# Patient Record
Sex: Female | Born: 1990 | Race: Black or African American | Hispanic: No | Marital: Single | Smoking: Never smoker
Health system: Southern US, Community
[De-identification: ages and names within clinical notes are randomized; demographics above are authoritative.]

## PROBLEM LIST (undated history)

## (undated) ENCOUNTER — Emergency Department (HOSPITAL_COMMUNITY): Admission: EM | Payer: Self-pay | Source: Home / Self Care

## (undated) DIAGNOSIS — L509 Urticaria, unspecified: Secondary | ICD-10-CM

## (undated) DIAGNOSIS — E119 Type 2 diabetes mellitus without complications: Secondary | ICD-10-CM

## (undated) DIAGNOSIS — E282 Polycystic ovarian syndrome: Secondary | ICD-10-CM

## (undated) HISTORY — DX: Urticaria, unspecified: L50.9

## (undated) HISTORY — DX: Type 2 diabetes mellitus without complications: E11.9

## (undated) HISTORY — DX: Polycystic ovarian syndrome: E28.2

---

## 2013-04-11 ENCOUNTER — Encounter: Payer: BC Managed Care – PPO | Attending: Family Medicine

## 2013-04-11 VITALS — Ht 62.0 in | Wt 236.6 lb

## 2013-04-11 DIAGNOSIS — Z713 Dietary counseling and surveillance: Secondary | ICD-10-CM | POA: Insufficient documentation

## 2013-04-11 DIAGNOSIS — E119 Type 2 diabetes mellitus without complications: Secondary | ICD-10-CM | POA: Insufficient documentation

## 2013-04-17 NOTE — Progress Notes (Signed)
Patient was seen on 04/11/13 for the first of a series of three diabetes self-management courses at the [facility].  Current HbA1c: 9.1%          11/2012  The following learning objectives were met by the patient during this class:  Describe diabetes  State some common risk factors for diabetes  Defines the role of glucose and insulin  Identifies type of diabetes and pathophysiology  Describe the relationship between diabetes and cardiovascular risk  State the members of the Healthcare Team  States the rationale for glucose monitoring  State when to test glucose  State their individual Target Range  State the importance of logging glucose readings  Describe how to interpret glucose readings  Identifies A1C target  Explain the correlation between A1c and eAG values  State symptoms and treatment of high blood glucose  State symptoms and treatment of low blood glucose  Explain proper technique for glucose testing  Identifies proper sharps disposal  Handouts given during class include:  Living Well with Diabetes book  Carb Counting and Meal Planning book  Meal Plan Card  Carbohydrate guide  Meal planning worksheet  Low Sodium Flavoring Tips  The diabetes portion plate  E0C to eAG Conversion Chart  Diabetes Medications  Diabetes Recommended Care Schedule  Support Group  Diabetes Success Plan  Core Class Satisfaction Survey  Follow-Up Plan:  Attend core 2

## 2013-04-18 DIAGNOSIS — E119 Type 2 diabetes mellitus without complications: Secondary | ICD-10-CM

## 2013-04-19 NOTE — Progress Notes (Signed)

## 2013-04-25 DIAGNOSIS — E119 Type 2 diabetes mellitus without complications: Secondary | ICD-10-CM

## 2013-04-25 NOTE — Progress Notes (Signed)
Patient was seen on 04/25/13 for the third of a series of three diabetes self-management courses at the [facility]. The following learning objectives were met by the patient during this class:    State the amount of activity recommended for healthy living   Describe activities suitable for individual needs   Identify ways to regularly incorporate activity into daily life   Identify barriers to activity and ways to over come these barriers  Identify diabetes medications being personally used and their primary action for lowering glucose and possible side effects   Describe role of stress on blood glucose and develop strategies to address psychosocial issues   Identify diabetes complications and ways to prevent them  Explain how to manage diabetes during illness   Evaluate success in meeting personal goal   Establish 2-3 goals that they will plan to diligently work on until they return for the  43-month follow-up visit  Goals:  Follow Diabetes Meal Plan as instructed  Aim for 15-30 mins of physical activity daily as tolerated  Bring food record and glucose log to your follow up visit  Your patient has established the following 4 month goals in their individualized success plan: I will count my carb choices at most meals and snacks I will test my glucose at least 3 times a day, 7 days a week  Your patient has identified these potential barriers to change:  I get so busy at times that I forget to check my glucose levels which should change  I also need to count my carbs  Your patient has identified their diabetes self-care support plan as  [facility]  All of my family and friends are aware and support me

## 2013-08-14 ENCOUNTER — Ambulatory Visit: Payer: BC Managed Care – PPO

## 2015-10-21 ENCOUNTER — Ambulatory Visit: Payer: Self-pay | Admitting: Dietician

## 2015-11-07 ENCOUNTER — Ambulatory Visit: Payer: Self-pay | Admitting: Dietician

## 2016-12-06 ENCOUNTER — Emergency Department (HOSPITAL_BASED_OUTPATIENT_CLINIC_OR_DEPARTMENT_OTHER): Payer: 59

## 2016-12-06 ENCOUNTER — Encounter (HOSPITAL_BASED_OUTPATIENT_CLINIC_OR_DEPARTMENT_OTHER): Payer: Self-pay

## 2016-12-06 ENCOUNTER — Emergency Department (HOSPITAL_BASED_OUTPATIENT_CLINIC_OR_DEPARTMENT_OTHER)
Admission: EM | Admit: 2016-12-06 | Discharge: 2016-12-06 | Disposition: A | Discharge status: Home / Self Care | Payer: 59 | Attending: Emergency Medicine | Admitting: Emergency Medicine

## 2016-12-06 DIAGNOSIS — N390 Urinary tract infection, site not specified: Secondary | ICD-10-CM

## 2016-12-06 DIAGNOSIS — R102 Pelvic and perineal pain unspecified side: Secondary | ICD-10-CM

## 2016-12-06 DIAGNOSIS — E119 Type 2 diabetes mellitus without complications: Secondary | ICD-10-CM | POA: Diagnosis not present

## 2016-12-06 DIAGNOSIS — N760 Acute vaginitis: Secondary | ICD-10-CM | POA: Diagnosis not present

## 2016-12-06 DIAGNOSIS — E282 Polycystic ovarian syndrome: Secondary | ICD-10-CM | POA: Diagnosis not present

## 2016-12-06 DIAGNOSIS — B9689 Other specified bacterial agents as the cause of diseases classified elsewhere: Secondary | ICD-10-CM

## 2016-12-06 LAB — PREGNANCY, URINE: PREG TEST UR: NEGATIVE

## 2016-12-06 LAB — URINALYSIS, MICROSCOPIC (REFLEX): RBC / HPF: NONE SEEN RBC/hpf (ref 0–5)

## 2016-12-06 LAB — WET PREP, GENITAL
Sperm: NONE SEEN
TRICH WET PREP: NONE SEEN
YEAST WET PREP: NONE SEEN

## 2016-12-06 LAB — URINALYSIS, ROUTINE W REFLEX MICROSCOPIC
GLUCOSE, UA: NEGATIVE mg/dL
Hgb urine dipstick: NEGATIVE
Ketones, ur: >80 mg/dL — AB
Nitrite: NEGATIVE
Protein, ur: 30 mg/dL — AB
SPECIFIC GRAVITY, URINE: 1.02 (ref 1.005–1.030)
pH: 6 (ref 5.0–8.0)

## 2016-12-06 LAB — CBG MONITORING, ED: Glucose-Capillary: 129 mg/dL — ABNORMAL HIGH (ref 65–99)

## 2016-12-06 MED ORDER — METRONIDAZOLE 500 MG PO TABS: 500.0000 mg | ORAL_TABLET | Freq: Two times a day (BID) | ORAL | 0 refills | Status: DC

## 2016-12-06 MED ORDER — AZITHROMYCIN 250 MG PO TABS
1000.0000 mg | ORAL_TABLET | Freq: Once | ORAL | Status: AC
  Administered 2016-12-06: 1000 mg via ORAL
  Filled 2016-12-06: qty 4

## 2016-12-06 MED ORDER — NITROFURANTOIN MONOHYD MACRO 100 MG PO CAPS: 100.0000 mg | ORAL_CAPSULE | Freq: Two times a day (BID) | ORAL | 0 refills | Status: DC

## 2016-12-06 MED ORDER — CEFTRIAXONE SODIUM 250 MG IJ SOLR
250.0000 mg | Freq: Once | INTRAMUSCULAR | Status: AC
  Administered 2016-12-06: 250 mg via INTRAMUSCULAR
  Filled 2016-12-06: qty 250

## 2016-12-06 MED ORDER — NAPROXEN 500 MG PO TABS: 500.0000 mg | ORAL_TABLET | Freq: Two times a day (BID) | ORAL | 0 refills | Status: DC

## 2016-12-06 NOTE — ED Notes (Signed)
ED Provider at bedside. 

## 2016-12-06 NOTE — ED Provider Notes (Signed)
[facility] EMERGENCY DEPARTMENT Provider Note   CSN: [id number] Arrival date & time: 12/06/16  4098     History   Chief Complaint Chief Complaint  Patient presents with  . Pelvic Pain    HPI Pamela Gill is a 26 y.o. female.  HPI Pamela Gill is a 26 y.o. female history of diabetes and polycystic ovarian syndrome, presents to emergency department complaining of pelvic pain.  Patient states her pain started approximately 3 days ago.  She reports at times sharp but mostly crampy pain in the lower abdomen, worse on the left.  She reports pain is worsened with any movement, with urination, with having a bowel movement.  She states pain started right after she finished her menstrual cycle.  She states her menstrual cycle was normal.  States she has not had intercourse in several months and does not believe she has STDs.  She denies any vaginal discharge or bleeding at this time.  She denies any urinary symptoms other than discomfort when she urinates.  She states she had a normal bowel movement yesterday, however reports some associated cramping. she denies any fever or chills.  She states she has had ovarian cysts in the past and states this feels similar but only worse.  She has not taken any medication to help with her pain.   Past Medical History:  Diagnosis Date  . Diabetes mellitus without complication (HCC)   . PCOS (polycystic ovarian syndrome)     There are no active problems to display for this patient.   History reviewed. No pertinent surgical history.  OB History    No data available       Home Medications    Prior to Admission medications   Medication Sig Start Date End Date Taking? Authorizing Provider  METFORMIN HCL PO Take by mouth.   Yes [provider]  norethindrone-ethinyl estradiol (JUNEL FE,GILDESS FE,LOESTRIN FE) 1-20 MG-MCG tablet Take 1 tablet by mouth See admin instructions. Take 1 tablet daily for three weeks, 1 week off.     [provider]    Family History History reviewed. No pertinent family history.  Social History Social History  Substance Use Topics  . Smoking status: Never Smoker  . Smokeless tobacco: Never Used  . Alcohol use No     Allergies   Metformin and related and Penicillins   Review of Systems Review of Systems  Constitutional: Negative for chills and fever.  Respiratory: Negative for cough, chest tightness and shortness of breath.   Cardiovascular: Negative for chest pain, palpitations and leg swelling.  Gastrointestinal: Positive for abdominal pain. Negative for diarrhea, nausea and vomiting.  Genitourinary: Positive for pelvic pain. Negative for dysuria, flank pain, vaginal bleeding, vaginal discharge and vaginal pain.  Musculoskeletal: Negative for arthralgias, myalgias, neck pain and neck stiffness.  Skin: Negative for rash.  Neurological: Negative for dizziness, weakness and headaches.  All other systems reviewed and are negative.    Physical Exam Updated Vital Signs BP 138/85 (BP Location: Left Arm)   Pulse (!) 108   Temp 98.7 F (37.1 C) (Oral)   Resp 20   Ht 5\' 2"  (1.575 m)   Wt 98 kg (216 lb)   LMP 11/30/2016   SpO2 100%   BMI 39.51 kg/m   Physical Exam  Constitutional: She appears well-developed and well-nourished. No distress.  HENT:  Head: Normocephalic.  Eyes: Conjunctivae are normal.  Neck: Neck supple.  Cardiovascular: Normal rate, regular rhythm and normal heart sounds.  Pulmonary/Chest: Effort normal and breath sounds normal. No respiratory distress. She has no wheezes. She has no rales.  Abdominal: Soft. Bowel sounds are normal. She exhibits no distension. There is tenderness. There is no rebound.  Diffuse tenderness, worse in the suprapubic area and left lower quadrant  Genitourinary:  Genitourinary Comments: Normal external genitalia.  White thick vaginal discharge.  Cervix is friable, erythematous.  Positive cervical motion  tenderness.  No uterine tenderness.  Positive left adnexal tenderness.  Musculoskeletal: She exhibits no edema.  Neurological: She is alert.  Skin: Skin is warm and dry.  Psychiatric: She has a normal mood and affect. Her behavior is normal.  Nursing note and vitals reviewed.    ED Treatments / Results  Labs (all labs ordered are listed, but only abnormal results are displayed) Labs Reviewed  WET PREP, GENITAL - Abnormal; Notable for the following:       Result Value   Clue Cells Wet Prep HPF POC PRESENT (*)    WBC, Wet Prep HPF POC MANY (*)    All other components within normal limits  URINALYSIS, ROUTINE W REFLEX MICROSCOPIC - Abnormal; Notable for the following:    APPearance CLOUDY (*)    Bilirubin Urine SMALL (*)    Ketones, ur >80 (*)    Protein, ur 30 (*)    Leukocytes, UA SMALL (*)    All other components within normal limits  URINALYSIS, MICROSCOPIC (REFLEX) - Abnormal; Notable for the following:    Bacteria, UA MANY (*)    Squamous Epithelial / LPF 6-30 (*)    All other components within normal limits  CBG MONITORING, ED - Abnormal; Notable for the following:    Glucose-Capillary 129 (*)    All other components within normal limits  PREGNANCY, URINE  GC/CHLAMYDIA PROBE AMP ([place]    EKG  EKG Interpretation None       Radiology [place] Transvaginal Non-ob  Result Date: 12/06/2016 CLINICAL DATA:  Left-sided pelvic pain for 4 days. Polycystic ovarian syndrome. Clinical suspicion for ovarian torsion. EXAM: TRANSABDOMINAL AND TRANSVAGINAL ULTRASOUND OF PELVIS DOPPLER ULTRASOUND OF OVARIES TECHNIQUE: Both transabdominal and transvaginal ultrasound examinations of the pelvis were performed. Transabdominal technique was performed for global imaging of the pelvis including uterus, ovaries, adnexal regions, and pelvic cul-de-sac. It was necessary to proceed with endovaginal exam following the transabdominal exam to visualize the endometrium and ovarian  cysts. Color and duplex Doppler ultrasound was utilized to evaluate blood flow to the ovaries. COMPARISON:  None. FINDINGS: Uterus Measurements: 7.4 x 3.3 x 4.3 cm. No fibroids or other mass visualized. Endometrium Thickness: 7 mm.  No focal abnormality visualized. Right ovary Measurements: 4.2 x 3.8 x 2.8 cm (volume = 23 cm^3). 2.4 cm follicular cyst noted. Otherwise normal appearance/no adnexal mass. Left ovary Measurements: 3.6 x 2.3 x 2.5 cm (volume = 11 cm^3). No evidence of ovarian or adnexal mass. Multiple less than 1cm follicles are seen throughout the ovary, predominantly peripheral in location. 1.7 cm follicular cysts also noted. Pulsed Doppler evaluation of both ovaries demonstrates normal low-resistance arterial and venous waveforms. Other findings No abnormal free fluid. IMPRESSION: No pelvic mass or other acute findings identified. No sonographic evidence for ovarian torsion. Bilateral polycystic ovarian morphology noted, consistent with known diagnosis of polycystic ovarian syndrome. Electronically Signed   By: Myles RosenthalJohn  Stahl M.D.   On: 12/06/2016 12:27   [place] Pelvis Complete  Result Date: 12/06/2016 CLINICAL DATA:  Left-sided pelvic pain for 4 days. Polycystic ovarian syndrome.  Clinical suspicion for ovarian torsion. EXAM: TRANSABDOMINAL AND TRANSVAGINAL ULTRASOUND OF PELVIS DOPPLER ULTRASOUND OF OVARIES TECHNIQUE: Both transabdominal and transvaginal ultrasound examinations of the pelvis were performed. Transabdominal technique was performed for global imaging of the pelvis including uterus, ovaries, adnexal regions, and pelvic cul-de-sac. It was necessary to proceed with endovaginal exam following the transabdominal exam to visualize the endometrium and ovarian cysts. Color and duplex Doppler ultrasound was utilized to evaluate blood flow to the ovaries. COMPARISON:  None. FINDINGS: Uterus Measurements: 7.4 x 3.3 x 4.3 cm. No fibroids or other mass visualized. Endometrium Thickness: 7 mm.  No focal  abnormality visualized. Right ovary Measurements: 4.2 x 3.8 x 2.8 cm (volume = 23 cm^3). 2.4 cm follicular cyst noted. Otherwise normal appearance/no adnexal mass. Left ovary Measurements: 3.6 x 2.3 x 2.5 cm (volume = 11 cm^3). No evidence of ovarian or adnexal mass. Multiple less than 1cm follicles are seen throughout the ovary, predominantly peripheral in location. 1.7 cm follicular cysts also noted. Pulsed Doppler evaluation of both ovaries demonstrates normal low-resistance arterial and venous waveforms. Other findings No abnormal free fluid. IMPRESSION: No pelvic mass or other acute findings identified. No sonographic evidence for ovarian torsion. Bilateral polycystic ovarian morphology noted, consistent with known diagnosis of polycystic ovarian syndrome. Electronically Signed   By: Myles Rosenthal M.D.   On: 12/06/2016 12:27   [place] Art/ven Flow Abd Pelv Doppler  Result Date: 12/06/2016 CLINICAL DATA:  Left-sided pelvic pain for 4 days. Polycystic ovarian syndrome. Clinical suspicion for ovarian torsion. EXAM: TRANSABDOMINAL AND TRANSVAGINAL ULTRASOUND OF PELVIS DOPPLER ULTRASOUND OF OVARIES TECHNIQUE: Both transabdominal and transvaginal ultrasound examinations of the pelvis were performed. Transabdominal technique was performed for global imaging of the pelvis including uterus, ovaries, adnexal regions, and pelvic cul-de-sac. It was necessary to proceed with endovaginal exam following the transabdominal exam to visualize the endometrium and ovarian cysts. Color and duplex Doppler ultrasound was utilized to evaluate blood flow to the ovaries. COMPARISON:  None. FINDINGS: Uterus Measurements: 7.4 x 3.3 x 4.3 cm. No fibroids or other mass visualized. Endometrium Thickness: 7 mm.  No focal abnormality visualized. Right ovary Measurements: 4.2 x 3.8 x 2.8 cm (volume = 23 cm^3). 2.4 cm follicular cyst noted. Otherwise normal appearance/no adnexal mass. Left ovary Measurements: 3.6 x 2.3 x 2.5 cm (volume = 11 cm^3).  No evidence of ovarian or adnexal mass. Multiple less than 1cm follicles are seen throughout the ovary, predominantly peripheral in location. 1.7 cm follicular cysts also noted. Pulsed Doppler evaluation of both ovaries demonstrates normal low-resistance arterial and venous waveforms. Other findings No abnormal free fluid. IMPRESSION: No pelvic mass or other acute findings identified. No sonographic evidence for ovarian torsion. Bilateral polycystic ovarian morphology noted, consistent with known diagnosis of polycystic ovarian syndrome. Electronically Signed   By: Myles Rosenthal M.D.   On: 12/06/2016 12:27    Procedures Procedures (including critical care time)  Medications Ordered in ED Medications - No data to display   Initial Impression / Assessment and Plan / ED Course  I have reviewed the triage vital signs and the nursing notes.  Pertinent labs & imaging results that were available during my care of the patient were reviewed by me and considered in my medical decision making (see chart for details).     Patient with pelvic pain, pelvic exam suspicious for cervicitis.  Urine showing greater than 80 ketones, patient states she has not had anything to eat or drink this morning yet.  Advised to drink lots of fluids.  Her blood sugar is 129.  She otherwise does not feel sick, no nausea or vomiting.  She does have many bacteria in urine.  Will start on Macrobid for possible UTI.  Wet prep showed many bacteria with clue cells.  Will treat for BV.  Also covered for possible gonorrhea and chlamydia with Rocephin 250 mg IM and Zithromax 1 g p.o.  Ultrasound performed to rule out ovarian torsion or tubo-ovarian.  The ultrasound is consistent with polycystic ovarian disease, no other acute findings.  At this time no concern for other intra-abdominal pathology, plan to discharge home with NSAIDs, antibiotics, follow-up as needed.  Return precautions discussed.  Vital signs are normal, patient in no acute  distress.  Vitals:   12/06/16 0922 12/06/16 1242  BP: 138/85 129/85  Pulse: (!) 108 98  Resp: 20 20  Temp: 98.7 F (37.1 C)   TempSrc: Oral   SpO2: 100% 98%  Weight: 98 kg (216 lb)   Height: 5\' 2"  (1.575 m)      Final Clinical Impressions(s) / ED Diagnoses   Final diagnoses:  Pelvic pain in female  Urinary tract infection without hematuria, site unspecified  Polycystic ovarian disease  Bacterial vaginitis    New Prescriptions New Prescriptions   METRONIDAZOLE (FLAGYL) 500 MG TABLET    Take 1 tablet (500 mg total) by mouth 2 (two) times daily.   NAPROXEN (NAPROSYN) 500 MG TABLET    Take 1 tablet (500 mg total) by mouth 2 (two) times daily.   NITROFURANTOIN, MACROCRYSTAL-MONOHYDRATE, (MACROBID) 100 MG CAPSULE    Take 1 capsule (100 mg total) by mouth 2 (two) times daily.     Jaynie Crumble, PA-C 12/06/16 1253    Arby Barrette, MD 12/09/16 2303

## 2016-12-06 NOTE — ED Notes (Signed)
PA and RN informed about CBG

## 2016-12-06 NOTE — ED Triage Notes (Signed)
Pt reports pelvic pain x4 days - denies n/v/d, rash, fever, or vaginal discharge. Pt has PCOS with pain in the same area in the past but not this intense.

## 2016-12-06 NOTE — Discharge Instructions (Signed)
Take naprosyn for pain. Macrobid until all gone for UTI. Flagyl for bacterial vaginosis. Follow up with family doctor. Return if worsening symptoms.

## 2016-12-07 LAB — GC/CHLAMYDIA PROBE AMP (~~LOC~~) NOT AT ARMC
CHLAMYDIA, DNA PROBE: NEGATIVE
Neisseria Gonorrhea: NEGATIVE

## 2017-03-13 ENCOUNTER — Encounter (HOSPITAL_COMMUNITY): Payer: Self-pay | Admitting: *Deleted

## 2017-03-13 ENCOUNTER — Ambulatory Visit (HOSPITAL_COMMUNITY)
Admission: EM | Admit: 2017-03-13 | Discharge: 2017-03-13 | Disposition: A | Discharge status: Home / Self Care | Payer: 59 | Attending: Family Medicine | Admitting: Family Medicine

## 2017-03-13 DIAGNOSIS — R05 Cough: Secondary | ICD-10-CM | POA: Diagnosis not present

## 2017-03-13 DIAGNOSIS — R69 Illness, unspecified: Secondary | ICD-10-CM | POA: Diagnosis not present

## 2017-03-13 DIAGNOSIS — R52 Pain, unspecified: Secondary | ICD-10-CM

## 2017-03-13 DIAGNOSIS — J111 Influenza due to unidentified influenza virus with other respiratory manifestations: Secondary | ICD-10-CM

## 2017-03-13 MED ORDER — HYDROCODONE-HOMATROPINE 5-1.5 MG/5ML PO SYRP: 5.0000 mL | ORAL_SOLUTION | Freq: Four times a day (QID) | ORAL | 0 refills | Status: DC | PRN

## 2017-03-13 MED ORDER — OSELTAMIVIR PHOSPHATE 75 MG PO CAPS: 75.0000 mg | ORAL_CAPSULE | Freq: Two times a day (BID) | ORAL | 0 refills | Status: DC

## 2017-03-13 NOTE — ED Notes (Signed)
Patient discharged by provider.

## 2017-03-13 NOTE — Discharge Instructions (Signed)

## 2017-03-13 NOTE — ED Triage Notes (Signed)
Patient reports 2-3 days of cold like symptoms, scratchy throat, headache, body aches.

## 2017-03-15 NOTE — ED Provider Notes (Signed)
  [facility]   [id number] 03/13/17 Arrival Time: 1721  ASSESSMENT & PLAN:  1. Influenza-like illness     Meds ordered this encounter  Medications  . oseltamivir (TAMIFLU) 75 MG capsule    Sig: Take 1 capsule (75 mg total) by mouth every 12 (twelve) hours.    Dispense:  10 capsule    Refill:  0  . HYDROcodone-homatropine (HYCODAN) 5-1.5 MG/5ML syrup    Sig: Take 5 mLs by mouth every 6 (six) hours as needed for cough.    Dispense:  90 mL    Refill:  0   Cough medication sedation precautions. Discussed typical duration of symptoms. OTC symptom care as needed. Ensure adequate fluid intake and rest. May f/u with PCP or here as needed.  Reviewed expectations re: course of current medical issues. Questions answered. Outlined signs and symptoms indicating need for more acute intervention. Patient verbalized understanding. After Visit Summary given.   SUBJECTIVE: History from: patient.  Pamela Gill is a 27 y.o. female who presents with complaint of nasal congestion, post-nasal drainage, and a persistent dry cough. Onset abrupt, approximately 1-2 days ago. Overall fatigued with body aches. SOB: none. Wheezing: none. Fever: subjective. Overall normal PO intake without n/v. Sick contacts: no. OTC treatment: cough medication without much help.  Received flu shot this year: no.  Social History   Tobacco Use  Smoking Status Never Smoker  Smokeless Tobacco Never Used    ROS: As per HPI.   OBJECTIVE:  Vitals:   03/13/17 1815  Pulse: 79  Resp: 18  Temp: 99.7 F (37.6 C)  TempSrc: Oral  SpO2: 100%     General appearance: alert; appears fatigued HEENT: nasal congestion; clear runny nose; throat irritation secondary to post-nasal drainage Neck: supple without LAD Lungs: unlabored respirations, symmetrical air entry; cough: moderate; no respiratory distress Skin: warm and dry Psychological: alert and cooperative; normal mood and affect   Allergies  Allergen  Reactions  . Metformin And Related Nausea And Vomiting  . Penicillins     Reaction unknown    Past Medical History:  Diagnosis Date  . Diabetes mellitus without complication (HCC)   . PCOS (polycystic ovarian syndrome)    Family History  Problem Relation Age of Onset  . Hypertension Mother    Social History   Socioeconomic History  . Marital status: Single    Spouse name: Not on file  . Number of children: Not on file  . Years of education: Not on file  . Highest education level: Not on file  Social Needs  . Financial resource strain: Not on file  . Food insecurity - worry: Not on file  . Food insecurity - inability: Not on file  . Transportation needs - medical: Not on file  . Transportation needs - non-medical: Not on file  Occupational History  . Not on file  Tobacco Use  . Smoking status: Never Smoker  . Smokeless tobacco: Never Used  Substance and Sexual Activity  . Alcohol use: No  . Drug use: No  . Sexual activity: Not on file  Other Topics Concern  . Not on file  Social History Narrative  . Not on file           Mardella LaymanHagler, Cristina Mattern, MD 03/15/17 1321

## 2017-08-06 DIAGNOSIS — Z1339 Encounter for screening examination for other mental health and behavioral disorders: Secondary | ICD-10-CM | POA: Diagnosis not present

## 2017-08-06 DIAGNOSIS — Z114 Encounter for screening for human immunodeficiency virus [HIV]: Secondary | ICD-10-CM | POA: Diagnosis not present

## 2017-08-06 DIAGNOSIS — R5383 Other fatigue: Secondary | ICD-10-CM | POA: Diagnosis not present

## 2017-08-06 DIAGNOSIS — Z1331 Encounter for screening for depression: Secondary | ICD-10-CM | POA: Diagnosis not present

## 2017-08-06 DIAGNOSIS — Z0000 Encounter for general adult medical examination without abnormal findings: Secondary | ICD-10-CM | POA: Diagnosis not present

## 2017-08-06 DIAGNOSIS — R87610 Atypical squamous cells of undetermined significance on cytologic smear of cervix (ASC-US): Secondary | ICD-10-CM | POA: Diagnosis not present

## 2017-08-06 DIAGNOSIS — E559 Vitamin D deficiency, unspecified: Secondary | ICD-10-CM | POA: Diagnosis not present

## 2017-08-06 DIAGNOSIS — E119 Type 2 diabetes mellitus without complications: Secondary | ICD-10-CM | POA: Diagnosis not present

## 2017-08-06 DIAGNOSIS — R0602 Shortness of breath: Secondary | ICD-10-CM | POA: Diagnosis not present

## 2017-08-06 DIAGNOSIS — Z7984 Long term (current) use of oral hypoglycemic drugs: Secondary | ICD-10-CM | POA: Diagnosis not present

## 2017-08-13 DIAGNOSIS — Z7984 Long term (current) use of oral hypoglycemic drugs: Secondary | ICD-10-CM | POA: Diagnosis not present

## 2017-08-13 DIAGNOSIS — E559 Vitamin D deficiency, unspecified: Secondary | ICD-10-CM | POA: Diagnosis not present

## 2017-08-13 DIAGNOSIS — E668 Other obesity: Secondary | ICD-10-CM | POA: Diagnosis not present

## 2017-08-13 DIAGNOSIS — E119 Type 2 diabetes mellitus without complications: Secondary | ICD-10-CM | POA: Diagnosis not present

## 2017-08-13 DIAGNOSIS — Z79899 Other long term (current) drug therapy: Secondary | ICD-10-CM | POA: Diagnosis not present

## 2017-09-17 DIAGNOSIS — N926 Irregular menstruation, unspecified: Secondary | ICD-10-CM | POA: Diagnosis not present

## 2017-09-17 DIAGNOSIS — E119 Type 2 diabetes mellitus without complications: Secondary | ICD-10-CM | POA: Diagnosis not present

## 2017-09-17 DIAGNOSIS — E559 Vitamin D deficiency, unspecified: Secondary | ICD-10-CM | POA: Diagnosis not present

## 2017-09-17 DIAGNOSIS — Z7984 Long term (current) use of oral hypoglycemic drugs: Secondary | ICD-10-CM | POA: Diagnosis not present

## 2017-09-17 DIAGNOSIS — E668 Other obesity: Secondary | ICD-10-CM | POA: Diagnosis not present

## 2017-11-29 ENCOUNTER — Ambulatory Visit (HOSPITAL_COMMUNITY)
Admission: EM | Admit: 2017-11-29 | Discharge: 2017-11-29 | Disposition: A | Discharge status: Home / Self Care | Payer: 59 | Attending: Family Medicine | Admitting: Family Medicine

## 2017-11-29 ENCOUNTER — Ambulatory Visit (INDEPENDENT_AMBULATORY_CARE_PROVIDER_SITE_OTHER): Payer: 59

## 2017-11-29 ENCOUNTER — Encounter (HOSPITAL_COMMUNITY): Payer: Self-pay | Admitting: Emergency Medicine

## 2017-11-29 DIAGNOSIS — S60221A Contusion of right hand, initial encounter: Secondary | ICD-10-CM | POA: Diagnosis not present

## 2017-11-29 DIAGNOSIS — M79641 Pain in right hand: Secondary | ICD-10-CM | POA: Diagnosis not present

## 2017-11-29 DIAGNOSIS — W230XXA Caught, crushed, jammed, or pinched between moving objects, initial encounter: Secondary | ICD-10-CM | POA: Diagnosis not present

## 2017-11-29 NOTE — Discharge Instructions (Addendum)
Ice Elevate Ibuprofen as needed pain and inflammation Limit use of hand while painful See your PCP if not better in a week

## 2017-11-29 NOTE — ED Triage Notes (Signed)
Pt states today she had her R hand caught in the car door, slammed in door. No bruising or swelling noted.  Pt c/o R thumb pain. Nail on thumb is cracked. No wrist pain.

## 2017-11-29 NOTE — ED Provider Notes (Signed)
[facility]    CSN: [id number] Arrival date & time: 11/29/17  4098     History   Chief Complaint Chief Complaint  Patient presents with  . Hand Pain    HPI Delona Clasby is a 27 y.o. female.   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 Otherwise well No prior injury  Past Medical History:  Diagnosis Date  . Diabetes mellitus without complication (HCC)   . PCOS (polycystic ovarian syndrome)     There are no active problems to display for this patient.   History reviewed. No pertinent surgical history.  OB History   None      Home Medications    Prior to Admission medications   Medication Sig Start Date End Date Taking? Authorizing Provider  METFORMIN HCL PO Take by mouth.    [provider]  norethindrone-ethinyl estradiol (JUNEL FE,GILDESS FE,LOESTRIN FE) 1-20 MG-MCG tablet Take 1 tablet by mouth See admin instructions. Take 1 tablet daily for three weeks, 1 week off.    [provider]    Family History Family History  Problem Relation Age of Onset  . Hypertension Mother     Social History Social History   Tobacco Use  . Smoking status: Never Smoker  . Smokeless tobacco: Never Used  Substance Use Topics  . Alcohol use: No  . Drug use: No     Allergies   Metformin and related and Penicillins   Review of Systems Review of Systems  Constitutional: Negative for chills and fever.  HENT: Negative for ear pain and sore throat.   Eyes: Negative for pain and visual disturbance.  Respiratory: Negative for cough and shortness of breath.   Cardiovascular: Negative for chest pain and palpitations.  Gastrointestinal: Negative for abdominal pain and vomiting.  Genitourinary: Negative for dysuria and hematuria.  Musculoskeletal: Positive for  arthralgias. Negative for back pain.  Skin: Negative for color change and rash.  Neurological: Negative for seizures and syncope.  All other systems reviewed and are negative.    Physical Exam Triage Vital Signs ED Triage Vitals  Enc Vitals Group     BP 11/29/17 1019 127/87     Pulse Rate 11/29/17 1019 78     Resp 11/29/17 1019 16     Temp 11/29/17 1019 98.6 F (37 C)     Temp src --      SpO2 11/29/17 1019 97 %     Weight --      Height --      Head Circumference --      Peak Flow --      Pain Score 11/29/17 1022 4     Pain Loc --      Pain Edu? --      Excl. in GC? --    No data found.  Updated Vital Signs BP 127/87   Pulse 78   Temp 98.6 F (37 C)   Resp 16   LMP 11/09/2017 (Exact Date)   SpO2 97%       Physical Exam  Constitutional: She appears well-developed and well-nourished. No distress.  HENT:  Head: Normocephalic and atraumatic.  Mouth/Throat: Oropharynx is clear and moist.  Eyes: Pupils are equal, round, and reactive to light. Conjunctivae are normal.  Neck: Normal range of motion.  Cardiovascular: Normal  rate.  Pulmonary/Chest: Effort normal. No respiratory distress.  Abdominal: Soft. She exhibits no distension.  Musculoskeletal: Normal range of motion. She exhibits no edema.  Tenderness around the PIP of right thumb with slight swelling and discoloration.  Tender web space and thenar eminence.  Cap refill intact.  Sensation intact  Neurological: She is alert.  Skin: Skin is warm and dry.  Psychiatric: She has a normal mood and affect. Her behavior is normal.     UC Treatments / Results  Labs (all labs ordered are listed, but only abnormal results are displayed) Labs Reviewed - No data to display  EKG None  Radiology Dg Hand Complete Right  Result Date: 11/29/2017 CLINICAL DATA:  27 year old female slammed right hand in car door this morning. Right thumbnail cracked. Pain including the mid 2nd metacarpal. EXAM: RIGHT HAND - COMPLETE 3+  VIEW COMPARISON:  None. FINDINGS: Bone mineralization is within normal limits. There is no evidence of fracture or dislocation. There is no evidence of arthropathy or other focal bone abnormality. No discrete soft tissue injury. No soft tissue gas. IMPRESSION: Negative. Electronically Signed   By: Odessa Fleming M.D.   On: 11/29/2017 10:44    Procedures Procedures (including critical care time)  Medications Ordered in UC Medications - No data to display  Initial Impression / Assessment and Plan / UC Course  I have reviewed the triage vital signs and the nursing notes.  Pertinent labs & imaging results that were available during my care of the patient were reviewed by me and considered in my medical decision making (see chart for details).     Discussed hand contusion.  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 Final Clinical Impressions(s) / UC Diagnoses   Final diagnoses:  Contusion of right hand, initial encounter     Discharge Instructions     Ice Elevate Ibuprofen as needed pain and inflammation Limit use of hand while painful See your PCP if not better in a week   ED Prescriptions    None     Controlled Substance Prescriptions [place] Controlled Substance Registry consulted? Not Applicable   Eustace Moore, MD 11/29/17 1106

## 2018-04-12 DIAGNOSIS — H5213 Myopia, bilateral: Secondary | ICD-10-CM | POA: Diagnosis not present

## 2018-11-21 DIAGNOSIS — Z30011 Encounter for initial prescription of contraceptive pills: Secondary | ICD-10-CM | POA: Diagnosis not present

## 2019-03-08 DIAGNOSIS — Z0000 Encounter for general adult medical examination without abnormal findings: Secondary | ICD-10-CM | POA: Diagnosis not present

## 2019-03-08 DIAGNOSIS — Z131 Encounter for screening for diabetes mellitus: Secondary | ICD-10-CM | POA: Diagnosis not present

## 2019-03-08 DIAGNOSIS — Z6841 Body Mass Index (BMI) 40.0 and over, adult: Secondary | ICD-10-CM | POA: Diagnosis not present

## 2019-03-08 DIAGNOSIS — L680 Hirsutism: Secondary | ICD-10-CM | POA: Diagnosis not present

## 2019-03-08 DIAGNOSIS — Z13220 Encounter for screening for lipoid disorders: Secondary | ICD-10-CM | POA: Diagnosis not present

## 2019-03-08 DIAGNOSIS — Z118 Encounter for screening for other infectious and parasitic diseases: Secondary | ICD-10-CM | POA: Diagnosis not present

## 2019-03-08 DIAGNOSIS — N898 Other specified noninflammatory disorders of vagina: Secondary | ICD-10-CM | POA: Diagnosis not present

## 2019-03-08 DIAGNOSIS — Z01419 Encounter for gynecological examination (general) (routine) without abnormal findings: Secondary | ICD-10-CM | POA: Diagnosis not present

## 2019-03-08 DIAGNOSIS — E282 Polycystic ovarian syndrome: Secondary | ICD-10-CM | POA: Diagnosis not present

## 2019-04-26 MED FILL — SPIRONOLACTONE 50 MG TABLET: 50 | 60 days supply | Qty: 60 | Fill #0

## 2019-05-15 DIAGNOSIS — Z3200 Encounter for pregnancy test, result unknown: Secondary | ICD-10-CM | POA: Diagnosis not present

## 2019-05-15 DIAGNOSIS — Z3689 Encounter for other specified antenatal screening: Secondary | ICD-10-CM | POA: Diagnosis not present

## 2019-06-30 IMAGING — US US PELVIS COMPLETE
1 series · 13 of 25 positions shown · non-contrast
Comparison: None.

CLINICAL DATA: Left-sided pelvic pain for 4 days. Polycystic
ovarian syndrome. Clinical suspicion for ovarian torsion.

EXAM:
TRANSABDOMINAL AND TRANSVAGINAL ULTRASOUND OF PELVIS
DOPPLER ULTRASOUND OF OVARIES
TECHNIQUE: Both transabdominal and transvaginal ultrasound examinations of the
pelvis were performed. Transabdominal technique was performed for
global imaging of the pelvis including uterus, ovaries, adnexal
regions, and pelvic cul-de-sac.
It was necessary to proceed with endovaginal exam following the
transabdominal exam to visualize the endometrium and ovarian cysts.
Color and duplex Doppler ultrasound was utilized to evaluate blood
flow to the ovaries.

[Series 1: us pelvis complete · 0.21mm/px · 13 of 76 slices shown]
[im 1/76]
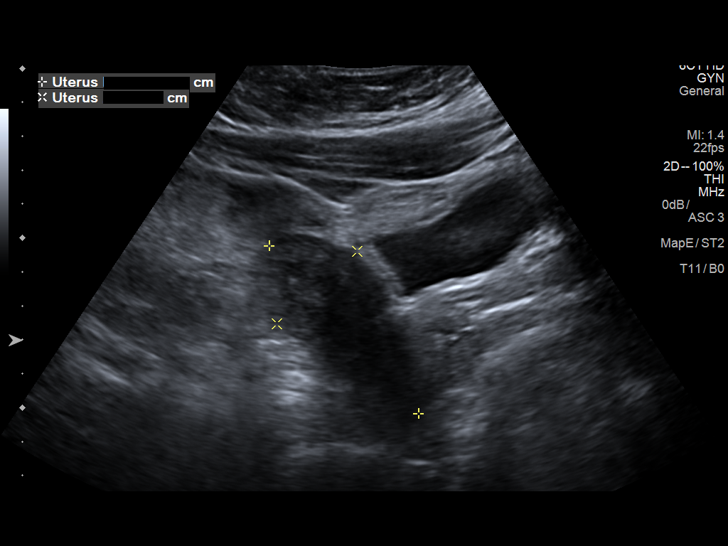
[im 7/76]
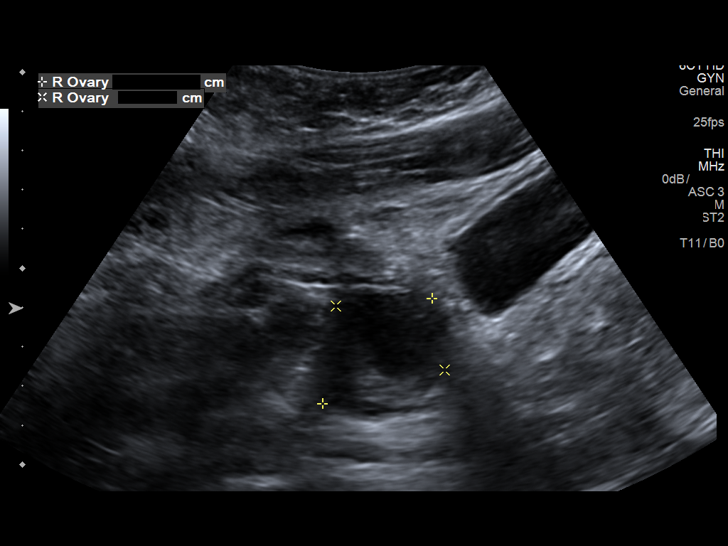
[im 13/76]
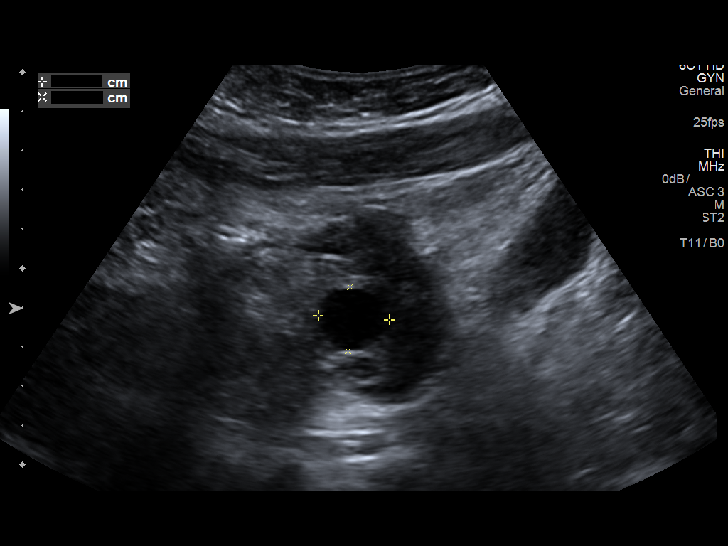
[im 19/76]
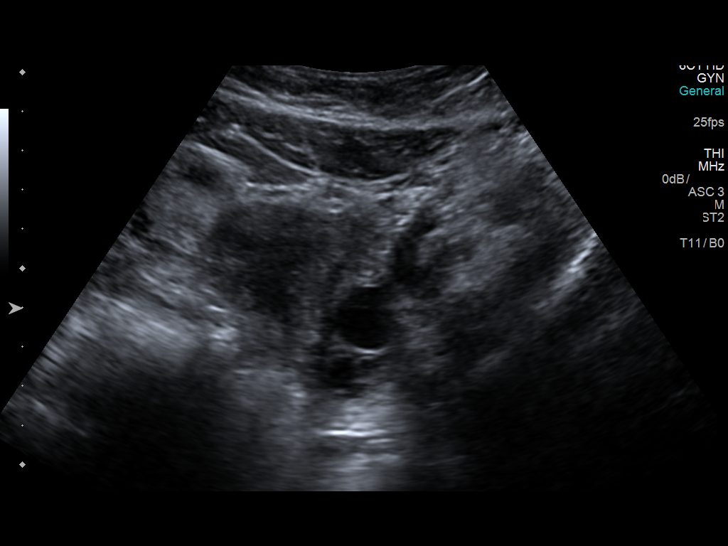
[im 26/76]
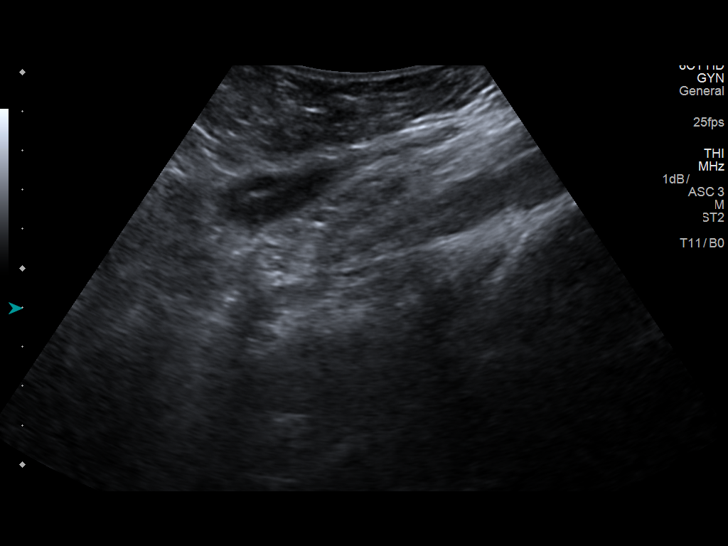
[im 32/76]
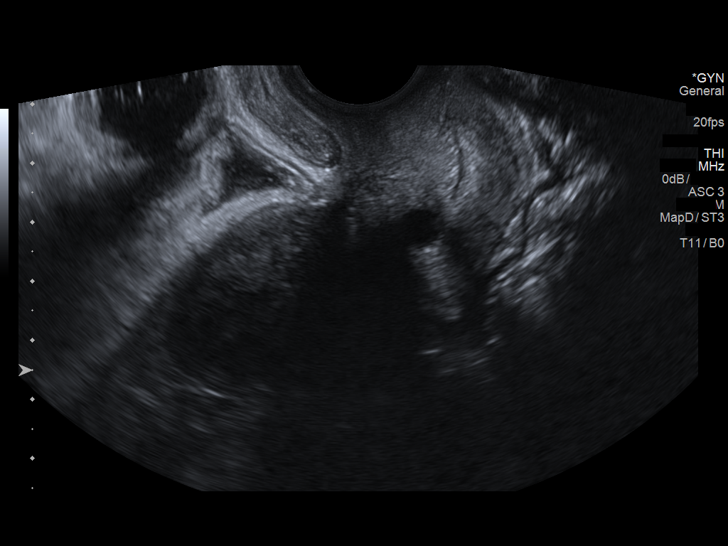
[im 38/76]
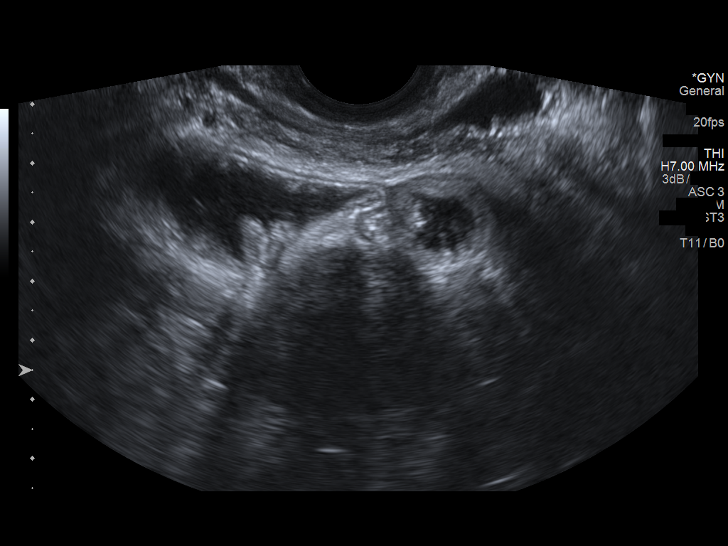
[im 44/76]
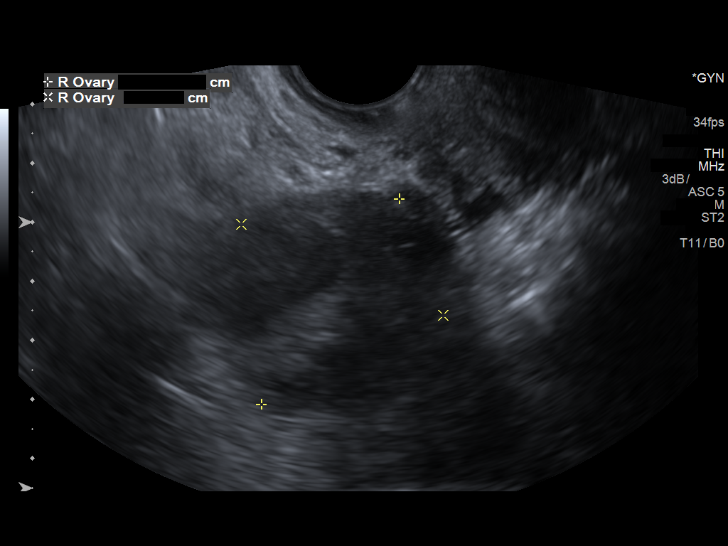
[im 51/76]
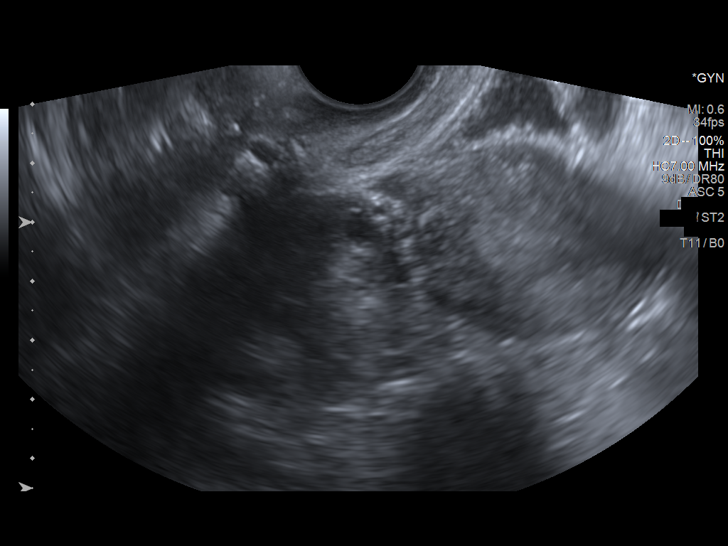
[im 57/76]
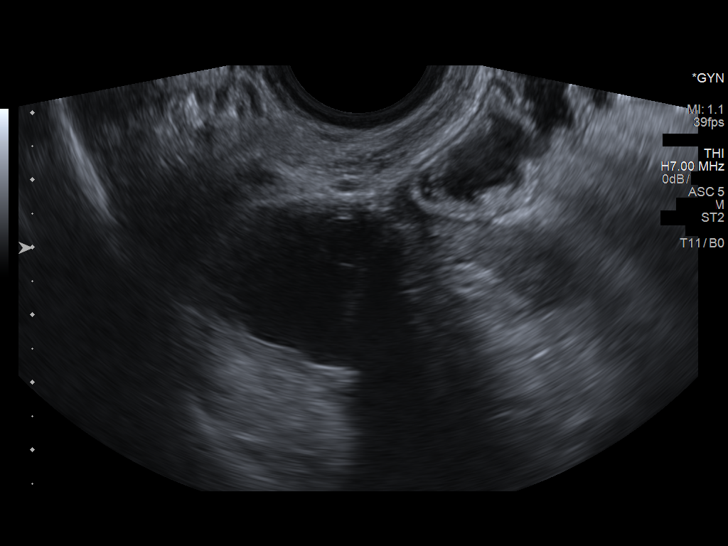
[im 63/76]
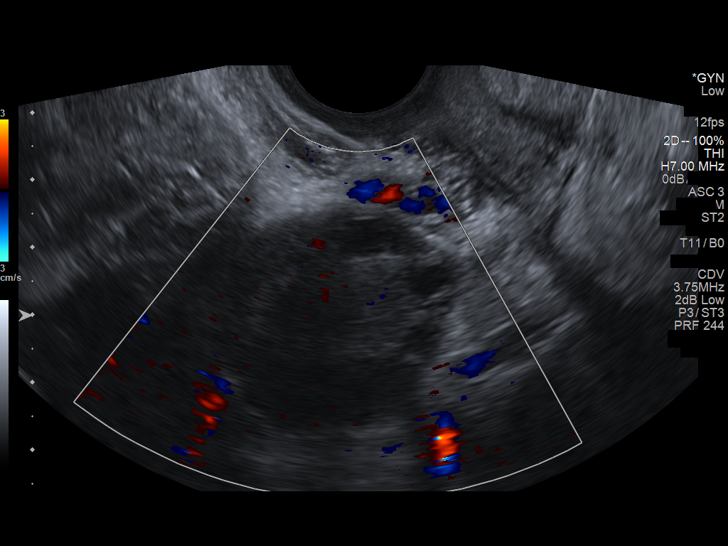
[im 69/76]
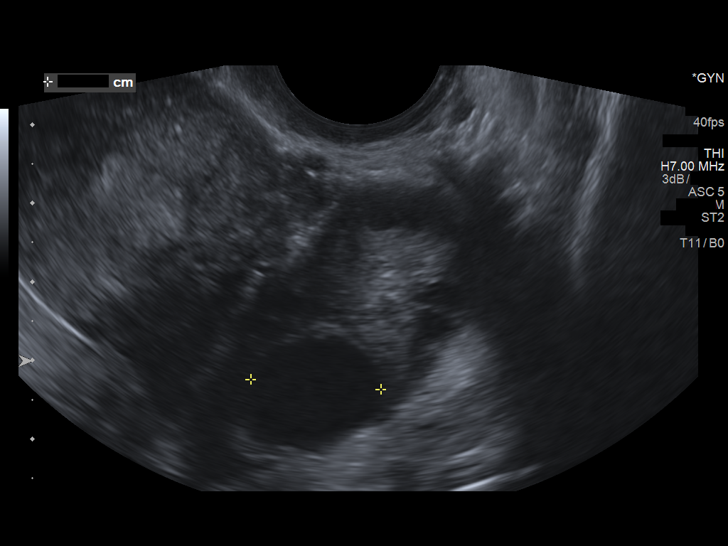
[im 76/76]
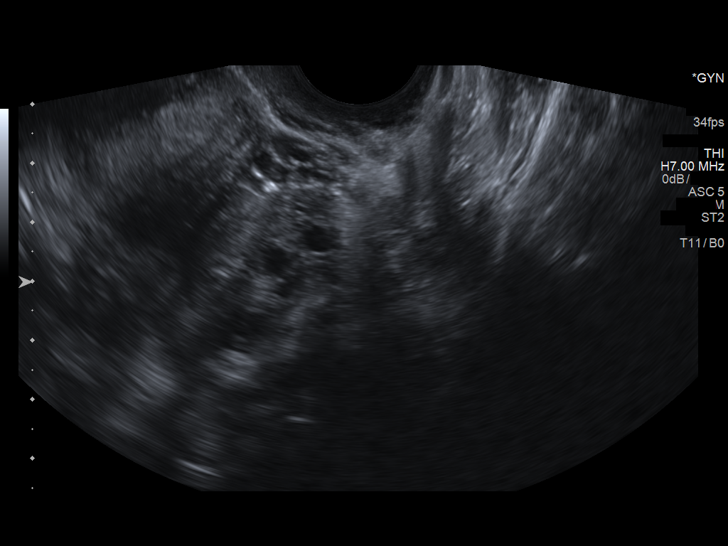

[13 of 25 positions shown; findings below may reference images not displayed]

FINDINGS: Uterus

Measurements: 7.4 x 3.3 x 4.3 cm. No fibroids or other mass
visualized.

Endometrium

Thickness: 7 mm.  No focal abnormality visualized.

Right ovary

Measurements: 4.2 x 3.8 x 2.8 cm (volume = 23 cm^3). 2.4 cm
follicular cyst noted. Otherwise normal appearance/no adnexal mass.

Left ovary

Measurements: 3.6 x 2.3 x 2.5 cm (volume = 11 cm^3). No evidence
of ovarian or adnexal mass. Multiple less than 1cm follicles are
seen throughout the ovary, predominantly peripheral in location.
cm follicular cysts also noted.

Pulsed Doppler evaluation of both ovaries demonstrates normal
low-resistance arterial and venous waveforms.

Other findings

No abnormal free fluid.
IMPRESSION: No pelvic mass or other acute findings identified. No sonographic
evidence for ovarian torsion.

Bilateral polycystic ovarian morphology noted, consistent with known
diagnosis of polycystic ovarian syndrome.

## 2019-12-20 NOTE — Progress Notes (Signed)
New Patient Note  RE: Pamela GammonYakira Gill MRN: [id number] DOB: 03-29-1990 Date of Office Visit: 12/21/2019  Referring provider: No ref. provider found Primary care provider: Juluis RainierBarnes, Elizabeth, MD  Chief Complaint: Sinus Problem (Months of sinus issues and getting worse)  History of Present Illness: I had the pleasure of seeing Pamela GammonYakira Gill for initial evaluation at the [facility] on 12/21/2019. She is a 29 y.o. female, who is self-referred here for the evaluation of rhinitis.  She reports symptoms of sore throat, PND, headaches, itchy ears, sneezing, nasal congestion, itchy/watery eyes. Symptoms have been going on for about 5-6 months The symptoms are present all year around. Other triggers include exposure to weather changes. Anosmia: no. Headache: yes. She has used zyrtec, OTC eye drops with fair improvement in symptoms. Sinus infections: in April. Previous work up includes: as a child and was positive to grass, cat, dust per patient report. No previous allergy injections. Previous ENT evaluation: no. Previous sinus imaging: no. History of nasal polyps: no. Last eye exam: within the past year. History of reflux: no. Patient had COVID-19 in August 2020.   Assessment and Plan: Pamela BombardYakira is a 29 y.o. female with: Other allergic rhinitis Rhino conjunctivitis symptoms worsening the past 5-6 months. Usually only flares during the summer months and had skin testing as a child which was positive to grass, cat and dust per patient report. Tried zyrtec and OTC eye drops with some benefit.  Today's skin testing showed: Positive to grass, weed, trees, dust mites, dog.  Start environmental control measures as below.  May use over the counter antihistamines such as Zyrtec (cetirizine), Claritin (loratadine), Allegra (fexofenadine), or Xyzal (levocetirizine) daily as needed. May take twice a day during flares.  May use Flonase (fluticasone) nasal spray 1 spray per nostril twice a day as  needed for nasal congestion.   May use azelastine nasal spray 1-2 sprays per nostril twice a day as needed for runny nose/drainage.  Nasal saline spray (i.e., Simply Saline) or nasal saline lavage (i.e., NeilMed) is recommended as needed and prior to medicated nasal sprays. May use olopatadine eye drops 0.2% once a day as needed for itchy/watery eyes. Start Singulair (montelukast) 10mg  daily at night. Cautioned that in some children/adults can experience behavioral changes including hyperactivity, agitation, depression, sleep disturbances and suicidal ideations. These side effects are rare, but if you notice them you should notify me and discontinue Singulair (montelukast).  If no improvement, then will recommend allergy immunotherapy next.   Read about allergy injections.   Allergic conjunctivitis of both eyes  See assessment and plan as above for allergic rhinitis.  Return in about 2 months (around 02/20/2020).  Meds ordered this encounter  Medications  . fluticasone (FLONASE) 50 MCG/ACT nasal spray    Sig: Place 1 spray into both nostrils 2 (two) times daily as needed for allergies or rhinitis (nasal congestion).    Dispense:  16 g    Refill:  5  . azelastine (ASTELIN) 0.1 % nasal spray    Sig: Place 1-2 sprays into both nostrils 2 (two) times daily as needed for rhinitis (runny nose). Use in each nostril as directed    Dispense:  30 mL    Refill:  5  . montelukast (SINGULAIR) 10 MG tablet    Sig: Take 1 tablet (10 mg total) by mouth at bedtime.    Dispense:  30 tablet    Refill:  5  . Olopatadine HCl 0.2 % SOLN    Sig:  Apply 1 drop to eye daily as needed (itchy/watery eyes).    Dispense:  2.5 mL    Refill:  5   Other allergy screening: Asthma: no Food allergy: no Medication allergy: yes  Penicillin - rash and itching Hymenoptera allergy: no Urticaria: yes from the grass. Eczema:no History of recurrent infections suggestive of immunodeficency: no  Diagnostics: Skin  Testing: Environmental allergy panel. Positive to grass, weed, trees, dust mites, dog. Results discussed with patient/family.  Airborne Adult Perc - 12/21/19 0911    Time Antigen Placed 7591    Allergen Manufacturer Waynette Buttery    Location Back    Number of Test 59    Panel 1 Select    1. Control-Buffer 50% Glycerol Negative    2. Control-Histamine 1 mg/ml 2+    3. Albumin saline Negative    4. Bahia 4+    5. French Southern Territories Negative    6. Johnson Negative    7. Kentucky Blue 4+    8. Meadow Fescue 2+    9. Perennial Rye 4+    10. Sweet Vernal Negative    11. Timothy 4+    12. Cocklebur Negative    13. Burweed Marshelder Negative    14. Ragweed, short Negative    15. Ragweed, Giant Negative    16. Plantain,  English 2+    17. Lamb's Quarters Negative    18. Sheep Sorrell Negative    19. Rough Pigweed Negative    20. Marsh Elder, Rough Negative    21. Mugwort, Common Negative    22. Ash mix Negative    23. Birch mix Negative    24. Beech American Negative    25. Box, Elder Negative    26. Cedar, red 3+    27. Cottonwood, Guinea-Bissau Negative    28. Elm mix Negative    29. Hickory 4+    30. Maple mix Negative    31. Oak, Guinea-Bissau mix Negative    32. Pecan Pollen 4+    33. Pine mix Negative    34. Sycamore Eastern Negative    35. Walnut, Black Pollen 4+    36. Alternaria alternata Negative    37. Cladosporium Herbarum Negative    38. Aspergillus mix Negative    39. Penicillium mix Negative    40. Bipolaris sorokiniana (Helminthosporium) Negative    41. Drechslera spicifera (Curvularia) Negative    42. Mucor plumbeus Negative    43. Fusarium moniliforme Negative    44. Aureobasidium pullulans (pullulara) Negative    45. Rhizopus oryzae Negative    46. Botrytis cinera Negative    47. Epicoccum nigrum Negative    48. Phoma betae Negative    49. Candida Albicans Negative    50. Trichophyton mentagrophytes Negative    51. Mite, D Farinae  5,000 AU/ml 3+    52. Mite, D Pteronyssinus   5,000 AU/ml Negative    53. Cat Hair 10,000 BAU/ml Negative    54.  Dog Epithelia Negative    55. Mixed Feathers Negative    56. Horse Epithelia Negative    57. Cockroach, German Negative    58. Mouse Negative    59. Tobacco Leaf Negative          Intradermal - 12/21/19 1011    Time Antigen Placed 6384    Allergen Manufacturer Waynette Buttery    Location Arm    Number of Test 12    Intradermal Select    Control Negative    [place] 3+  Johnson 3+    Ragweed mix Negative    Weed mix Omitted    Mold 1 Negative    Mold 2 Negative    Mold 3 Negative    Mold 4 Negative    Cat Negative    Dog 2+    Cockroach Negative           Past Medical History: Patient Active Problem List   Diagnosis Date Noted  . Other allergic rhinitis 12/21/2019  . Allergic conjunctivitis of both eyes 12/21/2019   Past Medical History:  Diagnosis Date  . Diabetes mellitus without complication (HCC)   . PCOS (polycystic ovarian syndrome)   . Urticaria    Past Surgical History: History reviewed. No pertinent surgical history. Medication List:  Current Outpatient Medications  Medication Sig Dispense Refill  . KAITLIB FE 0.8-25 MG-MCG tablet     . METFORMIN HCL PO Take by mouth.    . norethindrone-ethinyl estradiol (JUNEL FE,GILDESS FE,LOESTRIN FE) 1-20 MG-MCG tablet Take 1 tablet by mouth See admin instructions. Take 1 tablet daily for three weeks, 1 week off.    Marland Kitchen azelastine (ASTELIN) 0.1 % nasal spray Place 1-2 sprays into both nostrils 2 (two) times daily as needed for rhinitis (runny nose). Use in each nostril as directed 30 mL 5  . fluticasone (FLONASE) 50 MCG/ACT nasal spray Place 1 spray into both nostrils 2 (two) times daily as needed for allergies or rhinitis (nasal congestion). 16 g 5  . montelukast (SINGULAIR) 10 MG tablet Take 1 tablet (10 mg total) by mouth at bedtime. 30 tablet 5  . Olopatadine HCl 0.2 % SOLN Apply 1 drop to eye daily as needed (itchy/watery eyes). 2.5 mL 5   No current  facility-administered medications for this visit.   Allergies: Allergies  Allergen Reactions  . Penicillins     Reaction unknown   Social History: Social History   Socioeconomic History  . Marital status: Single    Spouse name: Not on file  . Number of children: Not on file  . Years of education: Not on file  . Highest education level: Not on file  Occupational History  . Not on file  Tobacco Use  . Smoking status: Never Smoker  . Smokeless tobacco: Never Used  Vaping Use  . Vaping Use: Never used  Substance and Sexual Activity  . Alcohol use: No  . Drug use: No  . Sexual activity: Not on file  Other Topics Concern  . Not on file  Social History Narrative  . Not on file   Social Determinants of Health   Financial Resource Strain:   . Difficulty of Paying Living Expenses: Not on file  Food Insecurity:   . Worried About Programme researcher, broadcasting/film/video in the Last Year: Not on file  . Ran Out of Food in the Last Year: Not on file  Transportation Needs:   . Lack of Transportation (Medical): Not on file  . Lack of Transportation (Non-Medical): Not on file  Physical Activity:   . Days of Exercise per Week: Not on file  . Minutes of Exercise per Session: Not on file  Stress:   . Feeling of Stress : Not on file  Social Connections:   . Frequency of Communication with Friends and Family: Not on file  . Frequency of Social Gatherings with Friends and Family: Not on file  . Attends Religious Services: Not on file  . Active Member of Clubs or Organizations: Not on file  . Attends Club  or Organization Meetings: Not on file  . Marital Status: Not on file   Lives in an apartment. 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 Hypertension Mother   .  Asthma Sister   . Eczema Brother   . Allergic rhinitis Neg Hx   . Urticaria Neg Hx    Review of Systems  Constitutional: Negative for appetite change, chills, fever and unexpected weight change.  HENT: Positive for congestion, postnasal drip, rhinorrhea, sneezing and sore throat.   Eyes: Positive for itching.  Respiratory: Negative for cough, chest tightness, shortness of breath and wheezing.   Cardiovascular: Negative for chest pain.  Gastrointestinal: Negative for abdominal pain.  Genitourinary: Negative for difficulty urinating.  Skin: Negative for rash.  Allergic/Immunologic: Positive for environmental allergies.  Neurological: Positive for headaches.   Objective: There were no vitals taken for this visit. There is no height or weight on file to calculate BMI. Physical Exam Vitals and nursing note reviewed.  Constitutional:      Appearance: Normal appearance. She is well-developed.  HENT:     Head: Normocephalic and atraumatic.     Right Ear: Tympanic membrane and external ear normal.     Left Ear: Tympanic membrane and external ear normal.     Nose: Nose normal.     Mouth/Throat:     Mouth: Mucous membranes are moist.     Pharynx: Oropharynx is clear.  Eyes:     Conjunctiva/sclera: Conjunctivae normal.  Cardiovascular:     Rate and Rhythm: Normal rate and regular rhythm.     Heart sounds: Normal heart sounds. No murmur heard.  No friction rub. No gallop.   Pulmonary:     Effort: Pulmonary effort is normal.     Breath sounds: Normal breath sounds. No wheezing, rhonchi or rales.  Musculoskeletal:     Cervical back: Neck supple.  Skin:    General: Skin is warm.     Findings: No rash.  Neurological:     Mental Status: She is alert and oriented to person, place, and time.  Psychiatric:        Behavior: Behavior normal.    The plan was reviewed with the patient/family, and all questions/concerned were addressed.  It was my pleasure to see Pamela Gill today and  participate in her care. Please feel free to contact me with any questions or concerns.  Sincerely,  Wyline Mood, DO Allergy & Immunology  [facility] office: [phone] [facility] office: [phone]

## 2019-12-21 ENCOUNTER — Other Ambulatory Visit: Payer: Self-pay

## 2019-12-21 ENCOUNTER — Encounter: Payer: Self-pay | Admitting: Allergy

## 2019-12-21 ENCOUNTER — Ambulatory Visit (INDEPENDENT_AMBULATORY_CARE_PROVIDER_SITE_OTHER): Payer: 59 | Admitting: Allergy

## 2019-12-21 DIAGNOSIS — J3089 Other allergic rhinitis: Secondary | ICD-10-CM | POA: Diagnosis not present

## 2019-12-21 DIAGNOSIS — H1013 Acute atopic conjunctivitis, bilateral: Secondary | ICD-10-CM | POA: Insufficient documentation

## 2019-12-21 MED ORDER — OLOPATADINE HCL 0.2 % OP SOLN: 1.0000 [drp] | Freq: Every day | OPHTHALMIC | 5 refills | Status: AC | PRN

## 2019-12-21 MED ORDER — FLUTICASONE PROPIONATE 50 MCG/ACT NA SUSP: 1.0000 | Freq: Two times a day (BID) | NASAL | 5 refills | Status: AC | PRN

## 2019-12-21 MED ORDER — MONTELUKAST SODIUM 10 MG PO TABS: 10.0000 mg | ORAL_TABLET | Freq: Every day | ORAL | 5 refills | Status: DC

## 2019-12-21 MED ORDER — AZELASTINE HCL 0.1 % NA SOLN: 1.0000 | Freq: Two times a day (BID) | NASAL | 5 refills | Status: AC | PRN

## 2019-12-21 NOTE — Assessment & Plan Note (Signed)
   See assessment and plan as above for allergic rhinitis.  

## 2019-12-21 NOTE — Assessment & Plan Note (Signed)
Rhino conjunctivitis symptoms worsening the past 5-6 months. Usually only flares during the summer months and had skin testing as a child which was positive to grass, cat and dust per patient report. Tried zyrtec and OTC eye drops with some benefit.  Today's skin testing showed: Positive to grass, weed, trees, dust mites, dog.  Start environmental control measures as below.  May use over the counter antihistamines such as Zyrtec (cetirizine), Claritin (loratadine), Allegra (fexofenadine), or Xyzal (levocetirizine) daily as needed. May take twice a day during flares.  May use Flonase (fluticasone) nasal spray 1 spray per nostril twice a day as needed for nasal congestion.   May use azelastine nasal spray 1-2 sprays per nostril twice a day as needed for runny nose/drainage.  Nasal saline spray (i.e., Simply Saline) or nasal saline lavage (i.e., NeilMed) is recommended as needed and prior to medicated nasal sprays. May use olopatadine eye drops 0.2% once a day as needed for itchy/watery eyes. Start Singulair (montelukast) 10mg  daily at night. Cautioned that in some children/adults can experience behavioral changes including hyperactivity, agitation, depression, sleep disturbances and suicidal ideations. These side effects are rare, but if you notice them you should notify me and discontinue Singulair (montelukast).  If no improvement, then will recommend allergy immunotherapy next.   Read about allergy injections.

## 2019-12-21 NOTE — Patient Instructions (Addendum)
Today's skin testing showed: Positive to grass, weed, trees, dust mites, dog. Results given.  Environmental allergies  Start environmental control measures as below.  May use over the counter antihistamines such as Zyrtec (cetirizine), Claritin (loratadine), Allegra (fexofenadine), or Xyzal (levocetirizine) daily as needed. May take twice a day during flares.  May use Flonase (fluticasone) nasal spray 1 spray per nostril twice a day as needed for nasal congestion.   May use azelastine nasal spray 1-2 sprays per nostril twice a day as needed for runny nose/drainage.  Nasal saline spray (i.e., Simply Saline) or nasal saline lavage (i.e., NeilMed) is recommended as needed and prior to medicated nasal sprays. May use olopatadine eye drops 0.2% once a day as needed for itchy/watery eyes. Start Singulair (montelukast) 10mg  daily at night. Cautioned that in some children/adults can experience behavioral changes including hyperactivity, agitation, depression, sleep disturbances and suicidal ideations. These side effects are rare, but if you notice them you should notify me and discontinue Singulair (montelukast).  If no improvement, then will recommend allergy immunotherapy next.   Read about allergy injections.   Follow up in 2 months or sooner if needed.   Reducing Pollen Exposure . Pollen seasons: trees (spring), grass (summer) and ragweed/weeds (fall). 06-18-1980 Keep windows closed in your home and car to lower pollen exposure.  Marland Kitchen air conditioning in the bedroom and throughout the house if possible.  . Avoid going out in dry windy days - especially early morning. . Pollen counts are highest between 5 - 10 AM and on dry, hot and windy days.  . Save outside activities for late afternoon or after a heavy rain, when pollen levels are lower.  . Avoid mowing of grass if you have grass pollen allergy. Lilian Kapur Be aware that pollen can also be transported indoors on people and pets.  . Dry your  clothes in an automatic dryer rather than hanging them outside where they might collect pollen.  . Rinse hair and eyes before bedtime. Control of House Dust Mite Allergen . Dust mite allergens are a common trigger of allergy and asthma symptoms. While they can be found throughout the house, these microscopic creatures thrive in warm, humid environments such as bedding, upholstered furniture and carpeting. . Because so much time is spent in the bedroom, it is essential to reduce mite levels there.  . Encase pillows, mattresses, and box springs in special allergen-proof fabric covers or airtight, zippered plastic covers.  . Bedding should be washed weekly in hot water (130 F) and dried in a hot dryer. Allergen-proof covers are available for comforters and pillows that can't be regularly washed.  Marland Kitchen the allergy-proof covers every few months. Minimize clutter in the bedroom. Keep pets out of the bedroom.  Reyes Ivan Keep humidity less than 50% by using a dehumidifier or air conditioning. You can buy a humidity measuring device called a hygrometer to monitor this.  . If possible, replace carpets with hardwood, linoleum, or washable area rugs. If that's not possible, vacuum frequently with a vacuum that has a HEPA filter. . Remove all upholstered furniture and non-washable window drapes from the bedroom. . Remove all non-washable stuffed toys from the bedroom.  Wash stuffed toys weekly. Pet Allergen Avoidance: . Contrary to popular opinion, there are no "hypoallergenic" breeds of dogs or cats. That is because people are not allergic to an animal's hair, but to an allergen found in the animal's saliva, dander (dead skin flakes) or urine. Pet allergy symptoms typically occur within minutes.  For some people, symptoms can build up and become most severe 8 to 12 hours after contact with the animal. People with severe allergies can experience reactions in public places if dander has been transported on the pet owners'  clothing. Marland Kitchen Keeping an animal outdoors is only a partial solution, since homes with pets in the yard still have higher concentrations of animal allergens. . Before getting a pet, ask your allergist to determine if you are allergic to animals. If your pet is already considered part of your family, try to minimize contact and keep the pet out of the bedroom and other rooms where you spend a great deal of time. . As with dust mites, vacuum carpets often or replace carpet with a hardwood floor, tile or linoleum. . High-efficiency particulate air (HEPA) cleaners can reduce allergen levels over time. . While dander and saliva are the source of cat and dog allergens, urine is the source of allergens from rabbits, hamsters, mice and [place] pigs; so ask a non-allergic family member to clean the animal's cage. . If you have a pet allergy, talk to your allergist about the potential for allergy immunotherapy (allergy shots). This strategy can often provide long-term relief.

## 2020-02-19 NOTE — Progress Notes (Signed)
Follow Up Note  RE: Pamela Gill MRN: [id number] DOB: [birth year] Date of Office Visit: 02/20/2020  Referring provider: Juluis Rainier, MD Primary care provider: Patient, No Pcp Per  Chief Complaint: Sinusitis and Allergic Rhinitis   History of Present Illness: I had the pleasure of seeing Pamela Gill for a follow up visit at the [facility] on 02/20/2020. She is a 30 y.o. female, who is being followed for allergic rhinoconjunctivitis. Her previous allergy office visit was on 12/21/2019 with Dr. Selena Batten. Today is a regular follow up visit.  Allergic rhino conjunctivitis Currently taking Singulair at night which helped with the PND and itching. Currently taking Claritin which works the same as the zyrtec.  The last few days has been having some ear itching, dry throat.   Taking Flonase 1 spray once a day with some benefit. No nosebleeds.  Tried azelastine with minimal benefit. No eye drop use.  Interested in starting allergy shots.   Assessment and Plan: Pamela Gill is a 30 y.o. female with: Seasonal and perennial allergic rhinoconjunctivitis Past history - Rhino conjunctivitis symptoms worsening the past 5-6 months. Usually only flares during the summer months and had skin testing as a child which was positive to grass, cat and dust per patient report. 2021 skin testing showed: Positive to grass, weed, trees, dust mites, dog. Interim history - still having symptoms. Interested in AIT.  Continue environmental control measures as below.  May use over the counter antihistamines such as Zyrtec (cetirizine), Claritin (loratadine), Allegra (fexofenadine), or Xyzal (levocetirizine) daily as needed. May take twice a day during flares.  May use Flonase (fluticasone) nasal spray 1 spray per nostril twice a day as needed for nasal congestion.   Try to take it more regularly to see if it helps with the itchy ears.  May use azelastine nasal spray 1-2 sprays per nostril twice a  day as needed for runny nose/drainage.  Nasal saline spray (i.e., Simply Saline) or nasal saline lavage (i.e., NeilMed) is recommended as needed and prior to medicated nasal sprays. May use olopatadine eye drops 0.2% once a day as needed for itchy/watery eyes. Continue Singulair (montelukast) 10mg  daily at night.  Start allergy injections - patient will call once checked with insurance.   Had a detailed discussion with patient/family that clinical history is suggestive of allergic rhinitis, and may benefit from allergy immunotherapy (AIT). Discussed in detail regarding the dosing, schedule, side effects (mild to moderate local allergic reaction and rarely systemic allergic reactions including anaphylaxis), and benefits (significant improvement in nasal symptoms, seasonal flares of asthma) of immunotherapy with the patient. There is significant time commitment involved with allergy shots, which includes weekly immunotherapy injections for first 9-12 months and then biweekly to monthly injections for 3-5 years. Consent was signed.  I have prescribed epinephrine injectable and demonstrated proper use. For mild symptoms you can take over the counter antihistamines such as Benadryl and monitor symptoms closely. If symptoms worsen or if you have severe symptoms including breathing issues, throat closure, significant swelling, whole body hives, severe diarrhea and vomiting, lightheadedness then inject epinephrine and seek immediate medical care afterwards. Emergency action plan given.   Return in about 4 months (around 06/19/2020).  Meds ordered this encounter  Medications   montelukast (SINGULAIR) 10 MG tablet    Sig: Take 1 tablet (10 mg total) by mouth at bedtime.    Dispense:  30 tablet    Refill:  5   EPINEPHrine 0.3 mg/0.3 mL IJ SOAJ injection  Sig: Inject 0.3 mg into the muscle as needed for anaphylaxis.    Dispense:  1 each    Refill:  2   Diagnostics: None  Medication List:  Current  Outpatient Medications  Medication Sig Dispense Refill   azelastine (ASTELIN) 0.1 % nasal spray Place 1-2 sprays into both nostrils 2 (two) times daily as needed for rhinitis (runny nose). Use in each nostril as directed 30 mL 5   EPINEPHrine 0.3 mg/0.3 mL IJ SOAJ injection Inject 0.3 mg into the muscle as needed for anaphylaxis. 1 each 2   fluticasone (FLONASE) 50 MCG/ACT nasal spray Place 1 spray into both nostrils 2 (two) times daily as needed for allergies or rhinitis (nasal congestion). 16 g 5   KAITLIB FE 0.8-25 MG-MCG tablet      METFORMIN HCL PO Take by mouth.     Olopatadine HCl 0.2 % SOLN Apply 1 drop to eye daily as needed (itchy/watery eyes). 2.5 mL 5   montelukast (SINGULAIR) 10 MG tablet Take 1 tablet (10 mg total) by mouth at bedtime. 30 tablet 5   norethindrone-ethinyl estradiol (JUNEL FE,GILDESS FE,LOESTRIN FE) 1-20 MG-MCG tablet Take 1 tablet by mouth See admin instructions. Take 1 tablet daily for three weeks, 1 week off. (Patient not taking: Reported on 02/20/2020)     No current facility-administered medications for this visit.   Allergies: Allergies  Allergen Reactions   Penicillins     Reaction unknown   I reviewed her past medical history, social history, family history, and environmental history and no significant changes have been reported from her previous visit.  Review of Systems  Constitutional: Negative for appetite change, chills, fever and unexpected weight change.  HENT: Positive for congestion, postnasal drip, rhinorrhea, sneezing and sore throat.   Eyes: Positive for itching.  Respiratory: Negative for cough, chest tightness, shortness of breath and wheezing.   Cardiovascular: Negative for chest pain.  Gastrointestinal: Negative for abdominal pain.  Genitourinary: Negative for difficulty urinating.  Skin: Negative for rash.  Allergic/Immunologic: Positive for environmental allergies.  Neurological: Positive for headaches.   Objective: BP  130/86 (BP Location: Left Arm, Patient Position: Sitting, Cuff Size: Large)    Pulse 97    Temp (!) 96.2 F (35.7 C) (Temporal)    Resp 18    Ht 5' 2.6" (1.59 m)    Wt 240 lb 12.8 oz (109.2 kg)    SpO2 98%    BMI 43.21 kg/m  Body mass index is 43.21 kg/m. Physical Exam Vitals and nursing note reviewed.  Constitutional:      Appearance: Normal appearance. She is well-developed.  HENT:     Head: Normocephalic and atraumatic.     Right Ear: Tympanic membrane and external ear normal.     Left Ear: Tympanic membrane and external ear normal.     Nose: Nose normal.     Mouth/Throat:     Mouth: Mucous membranes are moist.     Pharynx: Oropharynx is clear.  Eyes:     Conjunctiva/sclera: Conjunctivae normal.  Cardiovascular:     Rate and Rhythm: Normal rate and regular rhythm.     Heart sounds: Normal heart sounds. No murmur heard. No friction rub. No gallop.   Pulmonary:     Effort: Pulmonary effort is normal.     Breath sounds: Normal breath sounds. No wheezing, rhonchi or rales.  Musculoskeletal:     Cervical back: Neck supple.  Skin:    General: Skin is warm.     Findings: No  rash.  Neurological:     Mental Status: She is alert and oriented to person, place, and time.  Psychiatric:        Behavior: Behavior normal.    Previous notes and tests were reviewed. The plan was reviewed with the patient/family, and all questions/concerned were addressed.  It was my pleasure to see Pamela Gill today and participate in her care. Please feel free to contact me with any questions or concerns.  Sincerely,  Wyline Mood, DO Allergy & Immunology  [facility] office: [phone] [facility] office: [phone]

## 2020-02-20 ENCOUNTER — Other Ambulatory Visit: Payer: Self-pay

## 2020-02-20 ENCOUNTER — Encounter: Payer: Self-pay | Admitting: Allergy

## 2020-02-20 ENCOUNTER — Ambulatory Visit (INDEPENDENT_AMBULATORY_CARE_PROVIDER_SITE_OTHER): Payer: 59 | Admitting: Allergy

## 2020-02-20 VITALS — BP 130/86 | HR 97 | Temp 96.2°F | Resp 18 | Ht 62.6 in | Wt 240.8 lb

## 2020-02-20 DIAGNOSIS — J3089 Other allergic rhinitis: Secondary | ICD-10-CM | POA: Diagnosis not present

## 2020-02-20 DIAGNOSIS — J302 Other seasonal allergic rhinitis: Secondary | ICD-10-CM

## 2020-02-20 DIAGNOSIS — H1010 Acute atopic conjunctivitis, unspecified eye: Secondary | ICD-10-CM | POA: Insufficient documentation

## 2020-02-20 MED ORDER — MONTELUKAST SODIUM 10 MG PO TABS: 10.0000 mg | ORAL_TABLET | Freq: Every day | ORAL | 5 refills | Status: AC

## 2020-02-20 MED ORDER — EPINEPHRINE 0.3 MG/0.3ML IJ SOAJ: 0.3000 mg | INTRAMUSCULAR | 2 refills | Status: AC | PRN

## 2020-02-20 NOTE — Assessment & Plan Note (Signed)
Past history - Rhino conjunctivitis symptoms worsening the past 5-6 months. Usually only flares during the summer months and had skin testing as a child which was positive to grass, cat and dust per patient report. 2021 skin testing showed: Positive to grass, weed, trees, dust mites, dog. Interim history - still having symptoms. Interested in AIT.  Continue environmental control measures as below.  May use over the counter antihistamines such as Zyrtec (cetirizine), Claritin (loratadine), Allegra (fexofenadine), or Xyzal (levocetirizine) daily as needed. May take twice a day during flares.  May use Flonase (fluticasone) nasal spray 1 spray per nostril twice a day as needed for nasal congestion.   Try to take it more regularly to see if it helps with the itchy ears.  May use azelastine nasal spray 1-2 sprays per nostril twice a day as needed for runny nose/drainage.  Nasal saline spray (i.e., Simply Saline) or nasal saline lavage (i.e., NeilMed) is recommended as needed and prior to medicated nasal sprays. May use olopatadine eye drops 0.2% once a day as needed for itchy/watery eyes. Continue Singulair (montelukast) 10mg  daily at night.  Start allergy injections - patient will call once checked with insurance.   Had a detailed discussion with patient/family that clinical history is suggestive of allergic rhinitis, and may benefit from allergy immunotherapy (AIT). Discussed in detail regarding the dosing, schedule, side effects (mild to moderate local allergic reaction and rarely systemic allergic reactions including anaphylaxis), and benefits (significant improvement in nasal symptoms, seasonal flares of asthma) of immunotherapy with the patient. There is significant time commitment involved with allergy shots, which includes weekly immunotherapy injections for first 9-12 months and then biweekly to monthly injections for 3-5 years. Consent was signed.  I have prescribed epinephrine injectable  and demonstrated proper use. For mild symptoms you can take over the counter antihistamines such as Benadryl and monitor symptoms closely. If symptoms worsen or if you have severe symptoms including breathing issues, throat closure, significant swelling, whole body hives, severe diarrhea and vomiting, lightheadedness then inject epinephrine and seek immediate medical care afterwards. Emergency action plan given.

## 2020-02-20 NOTE — Patient Instructions (Addendum)
Environmental allergies  2021 skin testing showed: Positive to grass, weed, trees, dust mites, dog.  Continue environmental control measures as below.  May use over the counter antihistamines such as Zyrtec (cetirizine), Claritin (loratadine), Allegra (fexofenadine), or Xyzal (levocetirizine) daily as needed. May take twice a day during flares.  May use Flonase (fluticasone) nasal spray 1 spray per nostril twice a day as needed for nasal congestion.   Try to take it more regularly to see if it helps with the ears.   May use azelastine nasal spray 1-2 sprays per nostril twice a day as needed for runny nose/drainage.  Nasal saline spray (i.e., Simply Saline) or nasal saline lavage (i.e., NeilMed) is recommended as needed and prior to medicated nasal sprays. May use olopatadine eye drops 0.2% once a day as needed for itchy/watery eyes. Continue Singulair (montelukast) 10mg  daily at night.   Start allergy injections.  Had a detailed discussion with patient/family that clinical history is suggestive of allergic rhinitis, and may benefit from allergy immunotherapy (AIT). Discussed in detail regarding the dosing, schedule, side effects (mild to moderate local allergic reaction and rarely systemic allergic reactions including anaphylaxis), and benefits (significant improvement in nasal symptoms, seasonal flares of asthma) of immunotherapy with the patient. There is significant time commitment involved with allergy shots, which includes weekly immunotherapy injections for first 9-12 months and then biweekly to monthly injections for 3-5 years. Consent was signed.  I have prescribed epinephrine injectable and demonstrated proper use. For mild symptoms you can take over the counter antihistamines such as Benadryl and monitor symptoms closely. If symptoms worsen or if you have severe symptoms including breathing issues, throat closure, significant swelling, whole body hives, severe diarrhea and vomiting,  lightheadedness then inject epinephrine and seek immediate medical care afterwards. Emergency action plan given.   Follow up in 4 months or sooner if needed.  Follow up in 3 weeks for first injection.   Reducing Pollen Exposure . Pollen seasons: trees (spring), grass (summer) and ragweed/weeds (fall). 06-18-1980 Keep windows closed in your home and car to lower pollen exposure.  Marland Kitchen air conditioning in the bedroom and throughout the house if possible.  . Avoid going out in dry windy days - especially early morning. . Pollen counts are highest between 5 - 10 AM and on dry, hot and windy days.  . Save outside activities for late afternoon or after a heavy rain, when pollen levels are lower.  . Avoid mowing of grass if you have grass pollen allergy. Lilian Kapur Be aware that pollen can also be transported indoors on people and pets.  . Dry your clothes in an automatic dryer rather than hanging them outside where they might collect pollen.  . Rinse hair and eyes before bedtime. Control of House Dust Mite Allergen . Dust mite allergens are a common trigger of allergy and asthma symptoms. While they can be found throughout the house, these microscopic creatures thrive in warm, humid environments such as bedding, upholstered furniture and carpeting. . Because so much time is spent in the bedroom, it is essential to reduce mite levels there.  . Encase pillows, mattresses, and box springs in special allergen-proof fabric covers or airtight, zippered plastic covers.  . Bedding should be washed weekly in hot water (130 F) and dried in a hot dryer. Allergen-proof covers are available for comforters and pillows that can't be regularly washed.  Marland Kitchen the allergy-proof covers every few months. Minimize clutter in the bedroom. Keep pets out of the bedroom.  Reyes Ivan  Keep humidity less than 50% by using a dehumidifier or air conditioning. You can buy a humidity measuring device called a hygrometer to monitor this.  . If  possible, replace carpets with hardwood, linoleum, or washable area rugs. If that's not possible, vacuum frequently with a vacuum that has a HEPA filter. . Remove all upholstered furniture and non-washable window drapes from the bedroom. . Remove all non-washable stuffed toys from the bedroom.  Wash stuffed toys weekly. Pet Allergen Avoidance: . Contrary to popular opinion, there are no "hypoallergenic" breeds of dogs or cats. That is because people are not allergic to an animal's hair, but to an allergen found in the animal's saliva, dander (dead skin flakes) or urine. Pet allergy symptoms typically occur within minutes. For some people, symptoms can build up and become most severe 8 to 12 hours after contact with the animal. People with severe allergies can experience reactions in public places if dander has been transported on the pet owners' clothing. Marland Kitchen Keeping an animal outdoors is only a partial solution, since homes with pets in the yard still have higher concentrations of animal allergens. . Before getting a pet, ask your allergist to determine if you are allergic to animals. If your pet is already considered part of your family, try to minimize contact and keep the pet out of the bedroom and other rooms where you spend a great deal of time. . As with dust mites, vacuum carpets often or replace carpet with a hardwood floor, tile or linoleum. . High-efficiency particulate air (HEPA) cleaners can reduce allergen levels over time. . While dander and saliva are the source of cat and dog allergens, urine is the source of allergens from rabbits, hamsters, mice and [place] pigs; so ask a non-allergic family member to clean the animal's cage. . If you have a pet allergy, talk to your allergist about the potential for allergy immunotherapy (allergy shots). This strategy can often provide long-term relief.

## 2020-02-21 NOTE — Progress Notes (Signed)
Aeroallergen Immunotherapy    Patient Details  Name: Kamyiah Colantonio  MRN: [id number]  Date of Birth: 1990-07-05   Order 1 of 2   Vial Label: G-T   0.3 ml (Volume) BAU Concentration -- 7 Grass Mix* 100,000 ([address] [place], Piney, [place], Perennial [place], Red[place], Sweet Vernal, Timothy)  0.2 ml (Volume) 1:20 Concentration -- [place]  0.3 ml (Volume) BAU Concentration -- [place] 10,000  0.2 ml (Volume) 1:20 Concentration -- [place]  0.2 ml (Volume) 1:10 Concentration -- Cedar, red  0.2 ml (Volume) 1:10 Concentration -- [place]ory*  0.2 ml (Volume) 1:10 Concentration -- Pecan Pollen  0.2 ml (Volume) 1:20 Concentration -- Walnut, Black Pollen    1.8 ml Extract Subtotal  3.2 ml Diluent  5.0 ml Maintenance Total    Final Concentration above is stated in weight/volume (wt/vol). Allergen units (AU/ml) biological units (BAU/ml). The total volume is 5 ml.    Schedule: B   Special Instructions: may come in twice a week during build up.

## 2020-02-21 NOTE — Progress Notes (Signed)
Aeroallergen Immunotherapy    Patient Details  Name: Pamela Gill  MRN: [id number]  Date of Birth: Mar 05, 1990   Order 2 of 2   Vial Label: Dm-D   0.5 ml (Volume) 1:10 Concentration -- Dog Epithelia  0.5 ml (Volume)  AU Concentration -- Mite Mix (DF 5,000 & DP 5,000)    1.0 ml Extract Subtotal  4.0 ml Diluent  5.0 ml Maintenance Total    Final Concentration above is stated in weight/volume (wt/vol). Allergen units (AU/ml) biological units (BAU/ml). The total volume is 5 ml.    Schedule: B   Special Instructions: may come in twice a week during build up

## 2020-03-06 DIAGNOSIS — J301 Allergic rhinitis due to pollen: Secondary | ICD-10-CM

## 2020-03-06 NOTE — Progress Notes (Signed)
VIALS EXP 03-06-21 

## 2020-03-07 DIAGNOSIS — J3089 Other allergic rhinitis: Secondary | ICD-10-CM

## 2020-03-21 ENCOUNTER — Ambulatory Visit: Payer: 59

## 2020-03-22 DIAGNOSIS — E669 Obesity, unspecified: Secondary | ICD-10-CM | POA: Diagnosis not present

## 2020-03-22 DIAGNOSIS — N926 Irregular menstruation, unspecified: Secondary | ICD-10-CM | POA: Diagnosis not present

## 2020-03-22 DIAGNOSIS — Z113 Encounter for screening for infections with a predominantly sexual mode of transmission: Secondary | ICD-10-CM | POA: Diagnosis not present

## 2020-03-22 DIAGNOSIS — Z1389 Encounter for screening for other disorder: Secondary | ICD-10-CM | POA: Diagnosis not present

## 2020-03-22 DIAGNOSIS — R030 Elevated blood-pressure reading, without diagnosis of hypertension: Secondary | ICD-10-CM | POA: Diagnosis not present

## 2020-03-22 DIAGNOSIS — Z202 Contact with and (suspected) exposure to infections with a predominantly sexual mode of transmission: Secondary | ICD-10-CM | POA: Diagnosis not present

## 2020-03-22 DIAGNOSIS — Z01419 Encounter for gynecological examination (general) (routine) without abnormal findings: Secondary | ICD-10-CM | POA: Diagnosis not present

## 2020-03-22 DIAGNOSIS — Z130 Encounter for screening for diseases of the blood and blood-forming organs and certain disorders involving the immune mechanism: Secondary | ICD-10-CM | POA: Diagnosis not present

## 2020-03-22 DIAGNOSIS — E282 Polycystic ovarian syndrome: Secondary | ICD-10-CM | POA: Diagnosis not present

## 2020-03-22 DIAGNOSIS — Z3041 Encounter for surveillance of contraceptive pills: Secondary | ICD-10-CM | POA: Diagnosis not present

## 2020-03-26 ENCOUNTER — Ambulatory Visit: Payer: 59

## 2020-03-26 ENCOUNTER — Other Ambulatory Visit: Payer: Self-pay

## 2020-03-26 ENCOUNTER — Ambulatory Visit (INDEPENDENT_AMBULATORY_CARE_PROVIDER_SITE_OTHER): Payer: 59

## 2020-03-26 DIAGNOSIS — J309 Allergic rhinitis, unspecified: Secondary | ICD-10-CM

## 2020-03-26 NOTE — Progress Notes (Signed)
Immunotherapy   Patient Details  Name: Pamela Gill MRN: [id number] Date of Birth: Apr 01, 1990  03/26/2020  Mahlon Gammon here to start allergy injections. Patient received 0.30ml of both her blue vials with an expiration of 1/26/20231. One with G-T and the other with DM-D. Patient waited 30 minutes with no problems. Following schedule: B Frequency: 1-2 times weekly Epi-Pen: Yes Consent signed and patient instructions given.   Dub Mikes 03/26/2020, 2:10 PM

## 2020-04-04 ENCOUNTER — Ambulatory Visit (INDEPENDENT_AMBULATORY_CARE_PROVIDER_SITE_OTHER): Payer: 59

## 2020-04-04 ENCOUNTER — Other Ambulatory Visit: Payer: Self-pay

## 2020-04-04 DIAGNOSIS — J309 Allergic rhinitis, unspecified: Secondary | ICD-10-CM

## 2020-04-11 ENCOUNTER — Ambulatory Visit (INDEPENDENT_AMBULATORY_CARE_PROVIDER_SITE_OTHER): Payer: 59

## 2020-04-11 ENCOUNTER — Other Ambulatory Visit: Payer: Self-pay

## 2020-04-11 DIAGNOSIS — J309 Allergic rhinitis, unspecified: Secondary | ICD-10-CM | POA: Diagnosis not present

## 2020-04-18 ENCOUNTER — Ambulatory Visit (INDEPENDENT_AMBULATORY_CARE_PROVIDER_SITE_OTHER): Payer: 59

## 2020-04-18 ENCOUNTER — Other Ambulatory Visit: Payer: Self-pay

## 2020-04-18 DIAGNOSIS — J309 Allergic rhinitis, unspecified: Secondary | ICD-10-CM

## 2020-04-25 ENCOUNTER — Ambulatory Visit (INDEPENDENT_AMBULATORY_CARE_PROVIDER_SITE_OTHER): Payer: 59

## 2020-04-25 ENCOUNTER — Other Ambulatory Visit: Payer: Self-pay

## 2020-04-25 DIAGNOSIS — J309 Allergic rhinitis, unspecified: Secondary | ICD-10-CM | POA: Diagnosis not present

## 2020-05-02 ENCOUNTER — Other Ambulatory Visit: Payer: Self-pay

## 2020-05-02 ENCOUNTER — Ambulatory Visit (INDEPENDENT_AMBULATORY_CARE_PROVIDER_SITE_OTHER): Payer: 59

## 2020-05-02 DIAGNOSIS — J309 Allergic rhinitis, unspecified: Secondary | ICD-10-CM | POA: Diagnosis not present

## 2020-05-09 ENCOUNTER — Ambulatory Visit (INDEPENDENT_AMBULATORY_CARE_PROVIDER_SITE_OTHER): Payer: 59

## 2020-05-09 ENCOUNTER — Other Ambulatory Visit: Payer: Self-pay

## 2020-05-09 DIAGNOSIS — J309 Allergic rhinitis, unspecified: Secondary | ICD-10-CM

## 2020-05-16 ENCOUNTER — Ambulatory Visit (INDEPENDENT_AMBULATORY_CARE_PROVIDER_SITE_OTHER): Payer: 59

## 2020-05-16 ENCOUNTER — Other Ambulatory Visit: Payer: Self-pay

## 2020-05-16 DIAGNOSIS — J309 Allergic rhinitis, unspecified: Secondary | ICD-10-CM | POA: Diagnosis not present

## 2020-05-21 DIAGNOSIS — E119 Type 2 diabetes mellitus without complications: Secondary | ICD-10-CM | POA: Diagnosis not present

## 2020-05-23 ENCOUNTER — Other Ambulatory Visit: Payer: Self-pay

## 2020-05-23 ENCOUNTER — Ambulatory Visit (INDEPENDENT_AMBULATORY_CARE_PROVIDER_SITE_OTHER): Payer: 59

## 2020-05-23 DIAGNOSIS — J309 Allergic rhinitis, unspecified: Secondary | ICD-10-CM

## 2020-05-30 ENCOUNTER — Ambulatory Visit (INDEPENDENT_AMBULATORY_CARE_PROVIDER_SITE_OTHER): Payer: 59

## 2020-05-30 ENCOUNTER — Other Ambulatory Visit: Payer: Self-pay

## 2020-05-30 DIAGNOSIS — J309 Allergic rhinitis, unspecified: Secondary | ICD-10-CM

## 2020-06-13 ENCOUNTER — Ambulatory Visit (INDEPENDENT_AMBULATORY_CARE_PROVIDER_SITE_OTHER): Payer: 59

## 2020-06-13 ENCOUNTER — Other Ambulatory Visit: Payer: Self-pay

## 2020-06-13 DIAGNOSIS — J309 Allergic rhinitis, unspecified: Secondary | ICD-10-CM | POA: Diagnosis not present

## 2020-06-20 ENCOUNTER — Ambulatory Visit (INDEPENDENT_AMBULATORY_CARE_PROVIDER_SITE_OTHER): Payer: 59 | Admitting: Allergy

## 2020-06-20 ENCOUNTER — Other Ambulatory Visit: Payer: Self-pay

## 2020-06-20 ENCOUNTER — Encounter: Payer: Self-pay | Admitting: Allergy

## 2020-06-20 VITALS — BP 132/98 | HR 94 | Temp 96.8°F | Resp 16

## 2020-06-20 DIAGNOSIS — J302 Other seasonal allergic rhinitis: Secondary | ICD-10-CM | POA: Diagnosis not present

## 2020-06-20 DIAGNOSIS — H1013 Acute atopic conjunctivitis, bilateral: Secondary | ICD-10-CM

## 2020-06-20 DIAGNOSIS — H1010 Acute atopic conjunctivitis, unspecified eye: Secondary | ICD-10-CM

## 2020-06-20 DIAGNOSIS — J3089 Other allergic rhinitis: Secondary | ICD-10-CM

## 2020-06-20 DIAGNOSIS — J309 Allergic rhinitis, unspecified: Secondary | ICD-10-CM

## 2020-06-20 NOTE — Assessment & Plan Note (Signed)
Past history - Rhino conjunctivitis symptoms worsening the past 5-6 months. Usually only flares during the summer months and had skin testing as a child which was positive to grass, cat and dust per patient report. 2021 skin testing showed: Positive to grass, weed, trees, dust mites, dog. Interim history - started AIT on 03/26/2020 (G-T & DM-D). Still having some sinus headaches.  Continue allergy injections - given today.   Continue environmental control measures as below.  May use over the counter antihistamines such as Zyrtec (cetirizine), Claritin (loratadine), Allegra (fexofenadine), or Xyzal (levocetirizine) daily as needed. May take twice a day during flares.  May use Flonase (fluticasone) nasal spray 1 spray per nostril twice a day as needed for nasal congestion.   See if taking it twice a day helps with the headaches and the ear issues.   May use azelastine nasal spray 1-2 sprays per nostril twice a day as needed for runny nose/drainage.  Nasal saline spray (i.e., Simply Saline) or nasal saline lavage (i.e., NeilMed) is recommended as needed and prior to medicated nasal sprays. May use olopatadine eye drops 0.2% once a day as needed for itchy/watery eyes. Continue Singulair (montelukast) 10mg  daily at night.

## 2020-06-20 NOTE — Progress Notes (Signed)
Follow Up Note  RE: Pamela Gill MRN: [id number] DOB: [birth year] Date of Office Visit: 06/20/2020  Referring provider: No ref. provider found Primary care provider: Patient, No Pcp Per (Inactive)  Chief Complaint: Allergies (Still getting bad sinus headaches, not happening often maybe every two weeks and stays for a couple of days. )  History of Present Illness: I had the pleasure of seeing Pamela Gill for a follow up visit at the [facility] on 06/20/2020. She is a 30 y.o. female, who is being followed for allergic rhinoconjunctivitis on AIT. Her previous allergy office visit was on 02/19/2018 with Dr. Selena Batten. Today is a regular follow up visit.  Seasonal and perennial allergic rhinoconjunctivitis Started allergy injections in February and doing well on it. She had some large localized reactions.  Currently on montelukast daily, Flonase 1 spray per nostril in the morning - no nosebleeds, Claritin twice a day, and eye drops as needed. Still having sinus headaches every 2-3 weeks for a few days.  Not needed to use azelastine.   Assessment and Plan: Pamela Gill is a 30 y.o. female with: Seasonal and perennial allergic rhinoconjunctivitis Past history - Rhino conjunctivitis symptoms worsening the past 5-6 months. Usually only flares during the summer months and had skin testing as a child which was positive to grass, cat and dust per patient report. 2021 skin testing showed: Positive to grass, weed, trees, dust mites, dog. Interim history - started AIT on 03/26/2020 (G-T & DM-D). Still having some sinus headaches.  Continue allergy injections - given today.   Continue environmental control measures as below.  May use over the counter antihistamines such as Zyrtec (cetirizine), Claritin (loratadine), Allegra (fexofenadine), or Xyzal (levocetirizine) daily as needed. May take twice a day during flares.  May use Flonase (fluticasone) nasal spray 1 spray per nostril twice a day as  needed for nasal congestion.   See if taking it twice a day helps with the headaches and the ear issues.   May use azelastine nasal spray 1-2 sprays per nostril twice a day as needed for runny nose/drainage.  Nasal saline spray (i.e., Simply Saline) or nasal saline lavage (i.e., NeilMed) is recommended as needed and prior to medicated nasal sprays. May use olopatadine eye drops 0.2% once a day as needed for itchy/watery eyes. Continue Singulair (montelukast) 10mg  daily at night.  Return in about 6 months (around 12/21/2020).  No orders of the defined types were placed in this encounter.  Lab Orders  No laboratory test(s) ordered today    Diagnostics: None.  Medication List:  Current Outpatient Medications  Medication Sig Dispense Refill  . azelastine (ASTELIN) 0.1 % nasal spray Place 1-2 sprays into both nostrils 2 (two) times daily as needed for rhinitis (runny nose). Use in each nostril as directed 30 mL 5  . EPINEPHrine 0.3 mg/0.3 mL IJ SOAJ injection Inject 0.3 mg into the muscle as needed for anaphylaxis. 1 each 2  . fluticasone (FLONASE) 50 MCG/ACT nasal spray Place 1 spray into both nostrils 2 (two) times daily as needed for allergies or rhinitis (nasal congestion). 16 g 5  . KAITLIB FE 0.8-25 MG-MCG tablet     . METFORMIN HCL PO Take by mouth.    . montelukast (SINGULAIR) 10 MG tablet Take 1 tablet (10 mg total) by mouth at bedtime. 30 tablet 5  . Olopatadine HCl 0.2 % SOLN Apply 1 drop to eye daily as needed (itchy/watery eyes). 2.5 mL 5   No current facility-administered medications  for this visit.   Allergies: Allergies  Allergen Reactions  . Penicillins     Reaction unknown   I reviewed her past medical history, social history, family history, and environmental history and no significant changes have been reported from her previous visit.  Review of Systems  Constitutional: Negative for appetite change, chills, fever and unexpected weight change.  HENT:  Positive for congestion, postnasal drip, rhinorrhea, sinus pressure and sneezing.   Eyes: Positive for itching.  Respiratory: Negative for cough, chest tightness, shortness of breath and wheezing.   Cardiovascular: Negative for chest pain.  Gastrointestinal: Negative for abdominal pain.  Genitourinary: Negative for difficulty urinating.  Skin: Negative for rash.  Allergic/Immunologic: Positive for environmental allergies.  Neurological: Positive for headaches.   Objective: BP (!) 132/98 (BP Location: Right Arm, Patient Position: Sitting, Cuff Size: Large)   Pulse 94   Temp (!) 96.8 F (36 C) (Temporal)   Resp 16   SpO2 98%  There is no height or weight on file to calculate BMI. Physical Exam Vitals and nursing note reviewed.  Constitutional:      Appearance: Normal appearance. She is well-developed.  HENT:     Head: Normocephalic and atraumatic.     Right Ear: Tympanic membrane and external ear normal.     Left Ear: Tympanic membrane and external ear normal.     Nose: Nose normal.     Mouth/Throat:     Mouth: Mucous membranes are moist.     Pharynx: Oropharynx is clear.  Eyes:     Conjunctiva/sclera: Conjunctivae normal.  Cardiovascular:     Rate and Rhythm: Normal rate and regular rhythm.     Heart sounds: Normal heart sounds. No murmur heard. No friction rub. No gallop.   Pulmonary:     Effort: Pulmonary effort is normal.     Breath sounds: Normal breath sounds. No wheezing, rhonchi or rales.  Musculoskeletal:     Cervical back: Neck supple.  Skin:    General: Skin is warm.     Findings: No rash.  Neurological:     Mental Status: She is alert and oriented to person, place, and time.  Psychiatric:        Behavior: Behavior normal.    Previous notes and tests were reviewed. The plan was reviewed with the patient/family, and all questions/concerned were addressed.  It was my pleasure to see Pamela Gill today and participate in her care. Please feel free to contact me  with any questions or concerns.  Sincerely,  Wyline Mood, DO Allergy & Immunology  [facility] office: [phone] [facility] office: [phone]

## 2020-06-20 NOTE — Patient Instructions (Addendum)
Environmental allergies  2021 skin testing showed: Positive to grass, weed, trees, dust mites, dog.  Continue allergy injections - given today.   Continue environmental control measures as below.  May use over the counter antihistamines such as Zyrtec (cetirizine), Claritin (loratadine), Allegra (fexofenadine), or Xyzal (levocetirizine) daily as needed. May take twice a day during flares.  May use Flonase (fluticasone) nasal spray 1 spray per nostril twice a day as needed for nasal congestion.   See if taking it twice a day helps with the headaches and the ear issues.   May use azelastine nasal spray 1-2 sprays per nostril twice a day as needed for runny nose/drainage.  Nasal saline spray (i.e., Simply Saline) or nasal saline lavage (i.e., NeilMed) is recommended as needed and prior to medicated nasal sprays. May use olopatadine eye drops 0.2% once a day as needed for itchy/watery eyes. Continue Singulair (montelukast) 10mg  daily at night.  Follow up in 6 months or sooner if needed.   Reducing Pollen Exposure . Pollen seasons: trees (spring), grass (summer) and ragweed/weeds (fall). 06-18-1980 Keep windows closed in your home and car to lower pollen exposure.  Marland Kitchen air conditioning in the bedroom and throughout the house if possible.  . Avoid going out in dry windy days - especially early morning. . Pollen counts are highest between 5 - 10 AM and on dry, hot and windy days.  . Save outside activities for late afternoon or after a heavy rain, when pollen levels are lower.  . Avoid mowing of grass if you have grass pollen allergy. Lilian Kapur Be aware that pollen can also be transported indoors on people and pets.  . Dry your clothes in an automatic dryer rather than hanging them outside where they might collect pollen.  . Rinse hair and eyes before bedtime. Control of House Dust Mite Allergen . Dust mite allergens are a common trigger of allergy and asthma symptoms. While they can be found  throughout the house, these microscopic creatures thrive in warm, humid environments such as bedding, upholstered furniture and carpeting. . Because so much time is spent in the bedroom, it is essential to reduce mite levels there.  . Encase pillows, mattresses, and box springs in special allergen-proof fabric covers or airtight, zippered plastic covers.  . Bedding should be washed weekly in hot water (130 F) and dried in a hot dryer. Allergen-proof covers are available for comforters and pillows that can't be regularly washed.  Marland Kitchen the allergy-proof covers every few months. Minimize clutter in the bedroom. Keep pets out of the bedroom.  Reyes Ivan Keep humidity less than 50% by using a dehumidifier or air conditioning. You can buy a humidity measuring device called a hygrometer to monitor this.  . If possible, replace carpets with hardwood, linoleum, or washable area rugs. If that's not possible, vacuum frequently with a vacuum that has a HEPA filter. . Remove all upholstered furniture and non-washable window drapes from the bedroom. . Remove all non-washable stuffed toys from the bedroom.  Wash stuffed toys weekly. Pet Allergen Avoidance: . Contrary to popular opinion, there are no "hypoallergenic" breeds of dogs or cats. That is because people are not allergic to an animal's hair, but to an allergen found in the animal's saliva, dander (dead skin flakes) or urine. Pet allergy symptoms typically occur within minutes. For some people, symptoms can build up and become most severe 8 to 12 hours after contact with the animal. People with severe allergies can experience reactions in public places  if dander has been transported on the pet owners' clothing. Marland Kitchen Keeping an animal outdoors is only a partial solution, since homes with pets in the yard still have higher concentrations of animal allergens. . Before getting a pet, ask your allergist to determine if you are allergic to animals. If your pet is already  considered part of your family, try to minimize contact and keep the pet out of the bedroom and other rooms where you spend a great deal of time. . As with dust mites, vacuum carpets often or replace carpet with a hardwood floor, tile or linoleum. . High-efficiency particulate air (HEPA) cleaners can reduce allergen levels over time. . While dander and saliva are the source of cat and dog allergens, urine is the source of allergens from rabbits, hamsters, mice and [place] pigs; so ask a non-allergic family member to clean the animal's cage. . If you have a pet allergy, talk to your allergist about the potential for allergy immunotherapy (allergy shots). This strategy can often provide long-term relief.

## 2020-06-27 ENCOUNTER — Ambulatory Visit (INDEPENDENT_AMBULATORY_CARE_PROVIDER_SITE_OTHER): Payer: 59

## 2020-06-27 ENCOUNTER — Other Ambulatory Visit: Payer: Self-pay

## 2020-06-27 DIAGNOSIS — J309 Allergic rhinitis, unspecified: Secondary | ICD-10-CM

## 2020-07-04 ENCOUNTER — Ambulatory Visit (INDEPENDENT_AMBULATORY_CARE_PROVIDER_SITE_OTHER): Payer: 59

## 2020-07-04 ENCOUNTER — Other Ambulatory Visit: Payer: Self-pay

## 2020-07-04 DIAGNOSIS — J309 Allergic rhinitis, unspecified: Secondary | ICD-10-CM

## 2020-07-11 ENCOUNTER — Ambulatory Visit (INDEPENDENT_AMBULATORY_CARE_PROVIDER_SITE_OTHER): Payer: 59 | Admitting: *Deleted

## 2020-07-11 ENCOUNTER — Other Ambulatory Visit: Payer: Self-pay

## 2020-07-11 DIAGNOSIS — J309 Allergic rhinitis, unspecified: Secondary | ICD-10-CM

## 2020-07-18 ENCOUNTER — Ambulatory Visit (INDEPENDENT_AMBULATORY_CARE_PROVIDER_SITE_OTHER): Payer: 59

## 2020-07-18 ENCOUNTER — Other Ambulatory Visit: Payer: Self-pay

## 2020-07-18 DIAGNOSIS — J309 Allergic rhinitis, unspecified: Secondary | ICD-10-CM | POA: Diagnosis not present

## 2020-08-01 ENCOUNTER — Other Ambulatory Visit: Payer: Self-pay

## 2020-08-01 ENCOUNTER — Ambulatory Visit (INDEPENDENT_AMBULATORY_CARE_PROVIDER_SITE_OTHER): Payer: 59

## 2020-08-01 DIAGNOSIS — J309 Allergic rhinitis, unspecified: Secondary | ICD-10-CM | POA: Diagnosis not present

## 2020-08-07 ENCOUNTER — Encounter: Payer: Self-pay | Admitting: Nurse Practitioner

## 2020-08-07 ENCOUNTER — Ambulatory Visit: Payer: 59 | Admitting: Nurse Practitioner

## 2020-08-07 ENCOUNTER — Other Ambulatory Visit: Payer: Self-pay

## 2020-08-07 VITALS — BP 137/87 | HR 78 | Temp 97.7°F | Ht 62.5 in | Wt 241.0 lb

## 2020-08-07 DIAGNOSIS — E119 Type 2 diabetes mellitus without complications: Secondary | ICD-10-CM | POA: Diagnosis not present

## 2020-08-07 DIAGNOSIS — Z6841 Body Mass Index (BMI) 40.0 and over, adult: Secondary | ICD-10-CM | POA: Insufficient documentation

## 2020-08-07 LAB — BAYER DCA HB A1C WAIVED: HB A1C (BAYER DCA - WAIVED): 9.8 % — ABNORMAL HIGH (ref <7.0)

## 2020-08-07 NOTE — Addendum Note (Signed)
Addended by: Daryll Drown on: 08/07/2020 01:27 PM   Modules accepted: Orders

## 2020-08-07 NOTE — Assessment & Plan Note (Signed)
Patient currently has a BMI of 43.38 with a history of diabetes and PCOS.  Patient in the past has used [place] for weight loss but insurance is not currently covering prescription.  Provided education to patient printed handouts given 1 diet and exercise, calorie counting and  healthier meal choices.  Nutrition on fast track provided to patient.  We will follow-up with patient in 3 months.

## 2020-08-07 NOTE — Patient Instructions (Addendum)
Diabetes Mellitus Basics  Diabetes mellitus, or diabetes, is a long-term (chronic) disease. It occurs when the body does not properly use sugar (glucose) that is released from food after you eat. Diabetes mellitus may be caused by one or both of these problems: Your pancreas does not make enough of a hormone called insulin. Your body does not react in a normal way to the insulin that it makes. Insulin lets glucose enter cells in your body. This gives you energy. If you have diabetes, glucose cannot get into cells. This causes high blood glucose (hyperglycemia). How to treat and manage diabetes You may need to take insulin or other diabetes medicines daily to keep your glucose in balance. If you are prescribed insulin, you will learn how to give yourself insulin by injection. You may need to adjust the amount of insulin youtake based on the foods that you eat. You will need to check your blood glucose levels using a glucose monitor as told by your health care provider. The readings can help determine if you havelow or high blood glucose. Generally, you should have these blood glucose levels: Before meals (preprandial): 80-130 mg/dL (4.4-7.2 mmol/L). After meals (postprandial): below 180 mg/dL (10 mmol/L). Hemoglobin A1c (HbA1c) level: less than 7%. Your health care provider will set treatment goals for you. Keep all follow-up visits. This is important. Follow these instructions at home: Diabetes medicines Take your diabetes medicines every day as told by your health care provider. List your diabetes medicines here: Name of medicine: ______________________________ Amount (dose): _______________ Time (a.m./p.m.): _______________ Notes: ___________________________________ Name of medicine: ______________________________ Amount (dose): _______________ Time (a.m./p.m.): _______________ Notes: ___________________________________ Name of medicine: ______________________________ Amount (dose):  _______________ Time (a.m./p.m.): _______________ Notes: ___________________________________ Insulin If you use insulin, list the types of insulin you use here: Insulin type: ______________________________ Amount (dose): _______________ Time (a.m./p.m.): _______________Notes: ___________________________________ Insulin type: ______________________________ Amount (dose): _______________ Time (a.m./p.m.): _______________ Notes: ___________________________________ Insulin type: ______________________________ Amount (dose): _______________ Time (a.m./p.m.): _______________ Notes: ___________________________________ Insulin type: ______________________________ Amount (dose): _______________ Time (a.m./p.m.): _______________ Notes: ___________________________________ Insulin type: ______________________________ Amount (dose): _______________ Time (a.m./p.m.): _______________ Notes: ___________________________________ Managing blood glucose  Check your blood glucose levels using a glucose monitor as told by your healthcare provider. Write down the times that you check your glucose levels here: Time: _______________ Notes: ___________________________________ Time: _______________ Notes: ___________________________________ Time: _______________ Notes: ___________________________________ Time: _______________ Notes: ___________________________________ Time: _______________ Notes: ___________________________________ Time: _______________ Notes: ___________________________________  Low blood glucose Low blood glucose (hypoglycemia) is when glucose is at or below 70 mg/dL (3.9 mmol/L). Symptoms may include: Feeling: Hungry. Sweaty and clammy. Irritable or easily upset. Dizzy. Sleepy. Having: A fast heartbeat. A headache. A change in your vision. Numbness around the mouth, lips, or tongue. Having trouble with: Moving (coordination). Sleeping. Treating low blood glucose To treat low blood  glucose, eat or drink something containing sugar right away. If you can think clearly and swallow safely, follow the 15:15 rule: Take 15 grams of a fast-acting carb (carbohydrate), as told by your health care provider. Some fast-acting carbs are: Glucose tablets: take 3-4 tablets. Hard candy: eat 3-5 pieces. Fruit juice: drink 4 oz (120 mL). Regular (not diet) soda: drink 4-6 oz (120-180 mL). Honey or sugar: eat 1 Tbsp (15 mL). Check your blood glucose levels 15 minutes after you take the carb. If your glucose is still at or below 70 mg/dL (3.9 mmol/L), take 15 grams of a carb again. If your glucose does not go above 70 mg/dL (3.9 mmol/L) after 3 tries, get  help right away. After your glucose goes back to normal, eat a meal or a snack within 1 hour. Treating very low blood glucose If your glucose is at or below 54 mg/dL (3 mmol/L), you have very low blood glucose (severe hypoglycemia). This is an emergency. Do not wait to see if the symptoms will go away. Get medical help right away. Call your local emergency services (911 in the [place] Do not drive yourself to the hospital. Questions to ask your health care provider Should I talk with a diabetes educator? What equipment will I need to care for myself at home? What diabetes medicines do I need? When should I take them? How often do I need to check my blood glucose levels? What number can I call if I have questions? When is my follow-up visit? Where can I find a support group for people with diabetes? Where to find more information American Diabetes Association: www.diabetes.org Association of Diabetes Care and Education Specialists: www.diabeteseducator.org Contact a health care provider if: Your blood glucose is at or above 240 mg/dL (42.8 mmol/L) for 2 days in a row. You have been sick or have had a fever for 2 days or more, and you are not getting better. You have any of these problems for more than 6 hours: You cannot eat or  drink. You feel nauseous. You vomit. You have diarrhea. Get help right away if: Your blood glucose is lower than 54 mg/dL (3 mmol/L). You get confused. You have trouble thinking clearly. You have trouble breathing. These symptoms may represent a serious problem that is an emergency. Do not wait to see if the symptoms will go away. Get medical help right away. Call your local emergency services (911 in the [place] Do not drive yourself to the hospital. Summary Diabetes mellitus is a chronic disease that occurs when the body does not properly use sugar (glucose) that is released from food after you eat. Take insulin and diabetes medicines as told. Check your blood glucose every day, as often as told. Keep all follow-up visits. This is important. This information is not intended to replace advice given to you by your health care provider. Make sure you discuss any questions you have with your healthcare provider. Document Revised: 05/30/2019 Document Reviewed: 05/30/2019 Elsevier Patient Education  2022 [employer] BMI for Adults What is BMI? Body mass index (BMI) is a number that is calculated from a person's weight and height. BMI can help estimate how much of a person's weight is composed of fat. BMI does not measure body fat directly. Rather, it is an alternative toprocedures that directly measure body fat, which can be difficult and expensive. BMI can help identify people who may be at higher risk for certain medicalproblems. What are BMI measurements used for? BMI is used as a screening tool to identify possible weight problems. It helps determine whether a person is obese, overweight, a healthy weight, orunderweight. BMI is useful for: Identifying a weight problem that may be related to a medical condition or may increase the risk for medical problems. Promoting changes, such as changes in diet and exercise, to help reach a healthy weight. BMI screening can be repeated to see if these  changes are working. How is BMI calculated? BMI involves measuring your weight in relation to your height. Both height and weight are measured, and the BMI is calculated from those numbers. This can be done either in [place] ([place] or metric measurements. Note that charts and online BMI calculators  are available to help you find your BMI quickly andeasily without having to do these calculations yourself. To calculate your BMI in [place] ([place] measurements:  Measure your weight in pounds (lb). Multiply the number of pounds by 703. For example, for a person who weighs 180 lb, multiply that number by 703, which equals 126,540. Measure your height in inches. Then multiply that number by itself to get a measurement called "inches squared." For example, for a person who is 70 inches tall, the "inches squared" measurement is 70 inches x 70 inches, which equals 4,900 inches squared. Divide the total from step 2 (number of lb x 703) by the total from step 3 (inches squared): 126,540  4,900 = 25.8. This is your BMI.  To calculate your BMI in metric measurements: Measure your weight in kilograms (kg). Measure your height in meters (m). Then multiply that number by itself to get a measurement called "meters squared." For example, for a person who is 1.75 m tall, the "meters squared" measurement is 1.75 m x 1.75 m, which is equal to 3.1 meters squared. Divide the number of kilograms (your weight) by the meters squared number. In this example: 70  3.1 = 22.6. This is your BMI. What do the results mean? BMI charts are used to identify whether you are underweight, normal weight, overweight, or obese. The following guidelines will be used: Underweight: BMI less than 18.5. Normal weight: BMI between 18.5 and 24.9. Overweight: BMI between 25 and 29.9. Obese: BMI of 30 or above. Keep these notes in mind: Weight includes both fat and muscle, so someone with a muscular build, such as an athlete, may have a BMI  that is higher than 24.9. In cases like these, BMI is not an accurate measure of body fat. To determine if excess body fat is the cause of a BMI of 25 or higher, further assessments may need to be done by a health care provider. BMI is usually interpreted in the same way for men and women. Where to find more information For more information about BMI, including tools to quickly calculate your BMI, go to these websites: Centers for Disease Control and Prevention: FootballExhibition.com.br American Heart Association: www.heart.org National Heart, Lung, and Blood Institute: PopSteam.is Summary Body mass index (BMI) is a number that is calculated from a person's weight and height. BMI may help estimate how much of a person's weight is composed of fat. BMI can help identify those who may be at higher risk for certain medical problems. BMI can be measured using English measurements or metric measurements. BMI charts are used to identify whether you are underweight, normal weight, overweight, or obese. This information is not intended to replace advice given to you by your health care provider. Make sure you discuss any questions you have with your healthcare provider. Document Revised: 10/19/2018 Document Reviewed: 08/26/2018 Elsevier Patient Education  2022 [employer].

## 2020-08-07 NOTE — Progress Notes (Signed)
New Patient Note  RE: Pamela Gill MRN: [id number] DOB: [birth year] Date of Office Visit: 08/07/2020  Chief Complaint: Establish Care, Diabetes, and Obesity  History of Present Illness: Yet.  Sure is a 29 year old female who presents to clinic today to establish care and follow-up on diabetes and weight loss with a current BMI of 43.38 kg /m  The patient presents with history of type  2 diabetes mellitus without complications. Patient was diagnosed in 2015. Compliance with treatment has been good; the patient takes medication as directed , maintains a diabetic diet and an exercise regimen , follows up as directed , and is keeping a glucose diary. Patient specifically denies associated symptoms, including blurred vision, fatigue, polydipsia, polyphagia and polyuria . Patient denies hypoglycemia. In regard to preventative care, the patient performs foot self-exams daily and last ophthalmology exam was in, will schedule.Marland Kitchen     BMI Metric Follow Up - 08/07/20 1007       BMI Metric Follow Up-Please document annually   BMI Metric Follow Up Education provided           Extensively discussed calorie counting, weight loss, exercise. Assessment and Plan: Pamela Gill is a 30 y.o. female with: BMI 40.0-44.9, adult [facility]) Patient currently has a BMI of 43.38 with a history of diabetes and PCOS.  Patient in the past has used [place] for weight loss but insurance is not currently covering prescription.  Provided education to patient printed handouts given 1 diet and exercise, calorie counting and  healthier meal choices.  Nutrition on fast track provided to patient.  We will follow-up with patient in 3 months.  Diabetes mellitus without complication (HCC) Continue on metformin 1000 mg tablet by mouth daily.  Completed CBC, CMP and A1c. Continue diabetic diet, low calorie diet exercise at least 3 times a week recommended, appointment with clinical pharmacist scheduled for medication  management.  Follow-up in 3 months for chronic disease management and labs.  OI  Return in about 3 months (around 11/07/2020).   Diagnostics:   Past Medical History: Patient Active Problem List   Diagnosis Date Noted   BMI 40.0-44.9, adult (HCC) 08/07/2020   Diabetes mellitus without complication (HCC) 08/07/2020   Seasonal and perennial allergic rhinoconjunctivitis 02/20/2020   Past Medical History:  Diagnosis Date   Diabetes mellitus without complication (HCC)    PCOS (polycystic ovarian syndrome)    Urticaria    Past Surgical History: History reviewed. No pertinent surgical history. Medication List:  Current Outpatient Medications  Medication Sig Dispense Refill   azelastine (ASTELIN) 0.1 % nasal spray Place 1-2 sprays into both nostrils 2 (two) times daily as needed for rhinitis (runny nose). Use in each nostril as directed 30 mL 5   EPINEPHrine 0.3 mg/0.3 mL IJ SOAJ injection Inject 0.3 mg into the muscle as needed for anaphylaxis. 1 each 2   fluticasone (FLONASE) 50 MCG/ACT nasal spray Place 1 spray into both nostrils 2 (two) times daily as needed for allergies or rhinitis (nasal congestion). 16 g 5   KAITLIB FE 0.8-25 MG-MCG tablet      METFORMIN HCL PO Take by mouth.     montelukast (SINGULAIR) 10 MG tablet Take 1 tablet (10 mg total) by mouth at bedtime. 30 tablet 5   norethindrone (MICRONOR) 0.35 MG tablet Take 1 tablet by mouth daily.     Olopatadine HCl 0.2 % SOLN Apply 1 drop to eye daily as needed (itchy/watery eyes). 2.5 mL 5   spironolactone (ALDACTONE) 50 MG tablet Take 50  mg by mouth at bedtime.     No current facility-administered medications for this visit.   Allergies: Allergies  Allergen Reactions   Penicillins     Reaction unknown   Social History: Social History   Socioeconomic History   Marital status: Single    Spouse name: Not on file   Number of children: Not on file   Years of education: Not on file   Highest education level: Not on  file  Occupational History   Not on file  Tobacco Use   Smoking status: Never   Smokeless tobacco: Never  Vaping Use   Vaping Use: Never used  Substance and Sexual Activity   Alcohol use: No   Drug use: No   Sexual activity: Yes    Birth control/protection: Pill  Other Topics Concern   Not on file  Social History Narrative   Not on file   Social Determinants of Health   Financial Resource Strain: Not on file  Food Insecurity: Not on file  Transportation Needs: Not on file  Physical Activity: Not on file  Stress: Not on file  Social Connections: Not on file       Family History: Family History  Problem Relation Age of Onset   Hypertension Mother    Asthma Sister    Eczema Brother    Allergic rhinitis Neg Hx    Urticaria Neg Hx          Review of Systems  Constitutional: Negative.   HENT: Negative.    Eyes: Negative.   Respiratory: Negative.    Cardiovascular: Negative.   Gastrointestinal: Negative.   Musculoskeletal: Negative.   Skin:  Negative for rash.  Psychiatric/Behavioral: Negative.  The patient is not nervous/anxious.   All other systems reviewed and are negative. Objective: BP 137/87   Pulse 78   Temp 97.7 F (36.5 C) (Temporal)   Ht 5' 2.5" (1.588 m)   Wt 241 lb (109.3 kg)   LMP 08/06/2020 (Exact Date)   SpO2 98%   BMI 43.38 kg/m  Body mass index is 43.38 kg/m. Physical Exam Vitals and nursing note reviewed.  Constitutional:      Appearance: Normal appearance.  HENT:     Head: Normocephalic.     Nose: Nose normal.  Cardiovascular:     Rate and Rhythm: Normal rate and regular rhythm.  Pulmonary:     Effort: Pulmonary effort is normal.     Breath sounds: Normal breath sounds.  Abdominal:     General: Bowel sounds are normal.  Musculoskeletal:        General: Normal range of motion.  Skin:    General: Skin is warm.     Findings: No rash.  Neurological:     General: No focal deficit present.     Mental Status: She is alert and  oriented to person, place, and time.  Psychiatric:        Behavior: Behavior normal.   The plan was reviewed with the patient/family, and all questions/concerned were addressed.  It was my pleasure to see Pamela Gill today and participate in her care. Please feel free to contact me with any questions or concerns.  Sincerely,  Lynnell Chad NP [facility]

## 2020-08-07 NOTE — Addendum Note (Signed)
Addended by: Daryll Drown on: 08/07/2020 01:30 PM   Modules accepted: Orders

## 2020-08-07 NOTE — Assessment & Plan Note (Signed)
Continue on metformin 1000 mg tablet by mouth daily.  Completed CBC, CMP and A1c. Continue diabetic diet, low calorie diet exercise at least 3 times a week recommended, appointment with clinical pharmacist scheduled for medication management.  Follow-up in 3 months for chronic disease management and labs.  OI

## 2020-08-08 ENCOUNTER — Ambulatory Visit (INDEPENDENT_AMBULATORY_CARE_PROVIDER_SITE_OTHER): Payer: 59

## 2020-08-08 DIAGNOSIS — J309 Allergic rhinitis, unspecified: Secondary | ICD-10-CM

## 2020-08-08 LAB — COMPREHENSIVE METABOLIC PANEL
ALT: 19 IU/L (ref 0–32)
AST: 15 IU/L (ref 0–40)
Albumin/Globulin Ratio: 1.4 (ref 1.2–2.2)
Albumin: 4.3 g/dL (ref 3.9–5.0)
Alkaline Phosphatase: 87 IU/L (ref 44–121)
BUN/Creatinine Ratio: 14 (ref 9–23)
BUN: 11 mg/dL (ref 6–20)
Bilirubin Total: <0.2 mg/dL (ref 0.0–1.2)
CO2: 20 mmol/L (ref 20–29)
Calcium: 9.4 mg/dL (ref 8.7–10.2)
Chloride: 104 mmol/L (ref 96–106)
Creatinine, Ser: 0.8 mg/dL (ref 0.57–1.00)
Globulin, Total: 3.1 g/dL (ref 1.5–4.5)
Glucose: 213 mg/dL — ABNORMAL HIGH (ref 65–99)
Potassium: 5 mmol/L (ref 3.5–5.2)
Sodium: 138 mmol/L (ref 134–144)
Total Protein: 7.4 g/dL (ref 6.0–8.5)
eGFR: 102 mL/min/{1.73_m2} (ref >59)

## 2020-08-08 LAB — CBC WITH DIFFERENTIAL/PLATELET
Basophils Absolute: 0 10*3/uL (ref 0.0–0.2)
Basos: 1 %
EOS (ABSOLUTE): 0.2 10*3/uL (ref 0.0–0.4)
Eos: 4 %
Hematocrit: 44.3 % (ref 34.0–46.6)
Hemoglobin: 14.3 g/dL (ref 11.1–15.9)
Immature Grans (Abs): 0 10*3/uL (ref 0.0–0.1)
Immature Granulocytes: 0 %
Lymphocytes Absolute: 2 10*3/uL (ref 0.7–3.1)
Lymphs: 41 %
MCH: 27.4 pg (ref 26.6–33.0)
MCHC: 32.3 g/dL (ref 31.5–35.7)
MCV: 85 fL (ref 79–97)
Monocytes Absolute: 0.3 10*3/uL (ref 0.1–0.9)
Monocytes: 6 %
Neutrophils Absolute: 2.3 10*3/uL (ref 1.4–7.0)
Neutrophils: 48 %
Platelets: 258 10*3/uL (ref 150–450)
RBC: 5.21 x10E6/uL (ref 3.77–5.28)
RDW: 12.4 % (ref 11.7–15.4)
WBC: 4.9 10*3/uL (ref 3.4–10.8)

## 2020-08-08 LAB — LIPID PANEL
Chol/HDL Ratio: 4.3 ratio (ref 0.0–4.4)
Cholesterol, Total: 180 mg/dL (ref 100–199)
HDL: 42 mg/dL (ref >39)
LDL Chol Calc (NIH): 120 mg/dL — ABNORMAL HIGH (ref 0–99)
Triglycerides: 100 mg/dL (ref 0–149)
VLDL Cholesterol Cal: 18 mg/dL (ref 5–40)

## 2020-08-22 ENCOUNTER — Ambulatory Visit (INDEPENDENT_AMBULATORY_CARE_PROVIDER_SITE_OTHER): Payer: 59

## 2020-08-22 ENCOUNTER — Other Ambulatory Visit: Payer: Self-pay

## 2020-08-22 DIAGNOSIS — J309 Allergic rhinitis, unspecified: Secondary | ICD-10-CM

## 2020-08-29 ENCOUNTER — Ambulatory Visit (INDEPENDENT_AMBULATORY_CARE_PROVIDER_SITE_OTHER): Payer: 59

## 2020-08-29 ENCOUNTER — Other Ambulatory Visit: Payer: Self-pay

## 2020-08-29 DIAGNOSIS — J309 Allergic rhinitis, unspecified: Secondary | ICD-10-CM

## 2020-09-05 ENCOUNTER — Ambulatory Visit (INDEPENDENT_AMBULATORY_CARE_PROVIDER_SITE_OTHER): Payer: 59

## 2020-09-05 DIAGNOSIS — J309 Allergic rhinitis, unspecified: Secondary | ICD-10-CM

## 2020-09-12 ENCOUNTER — Ambulatory Visit (INDEPENDENT_AMBULATORY_CARE_PROVIDER_SITE_OTHER): Payer: 59

## 2020-09-12 ENCOUNTER — Other Ambulatory Visit: Payer: Self-pay

## 2020-09-12 DIAGNOSIS — J309 Allergic rhinitis, unspecified: Secondary | ICD-10-CM

## 2020-09-19 ENCOUNTER — Ambulatory Visit (INDEPENDENT_AMBULATORY_CARE_PROVIDER_SITE_OTHER): Payer: 59

## 2020-09-19 ENCOUNTER — Other Ambulatory Visit: Payer: Self-pay

## 2020-09-19 DIAGNOSIS — J309 Allergic rhinitis, unspecified: Secondary | ICD-10-CM | POA: Diagnosis not present

## 2020-09-22 IMAGING — DX DG HAND COMPLETE 3+V*R*
3 series · 3 of 3 positions shown · non-contrast
Comparison: None.

CLINICAL DATA: 27-year-old female slammed right hand in car door
this morning. Right thumbnail cracked. Pain including the mid 2nd
metacarpal.

EXAM:
RIGHT HAND - COMPLETE 3+ VIEW

[hand pa]
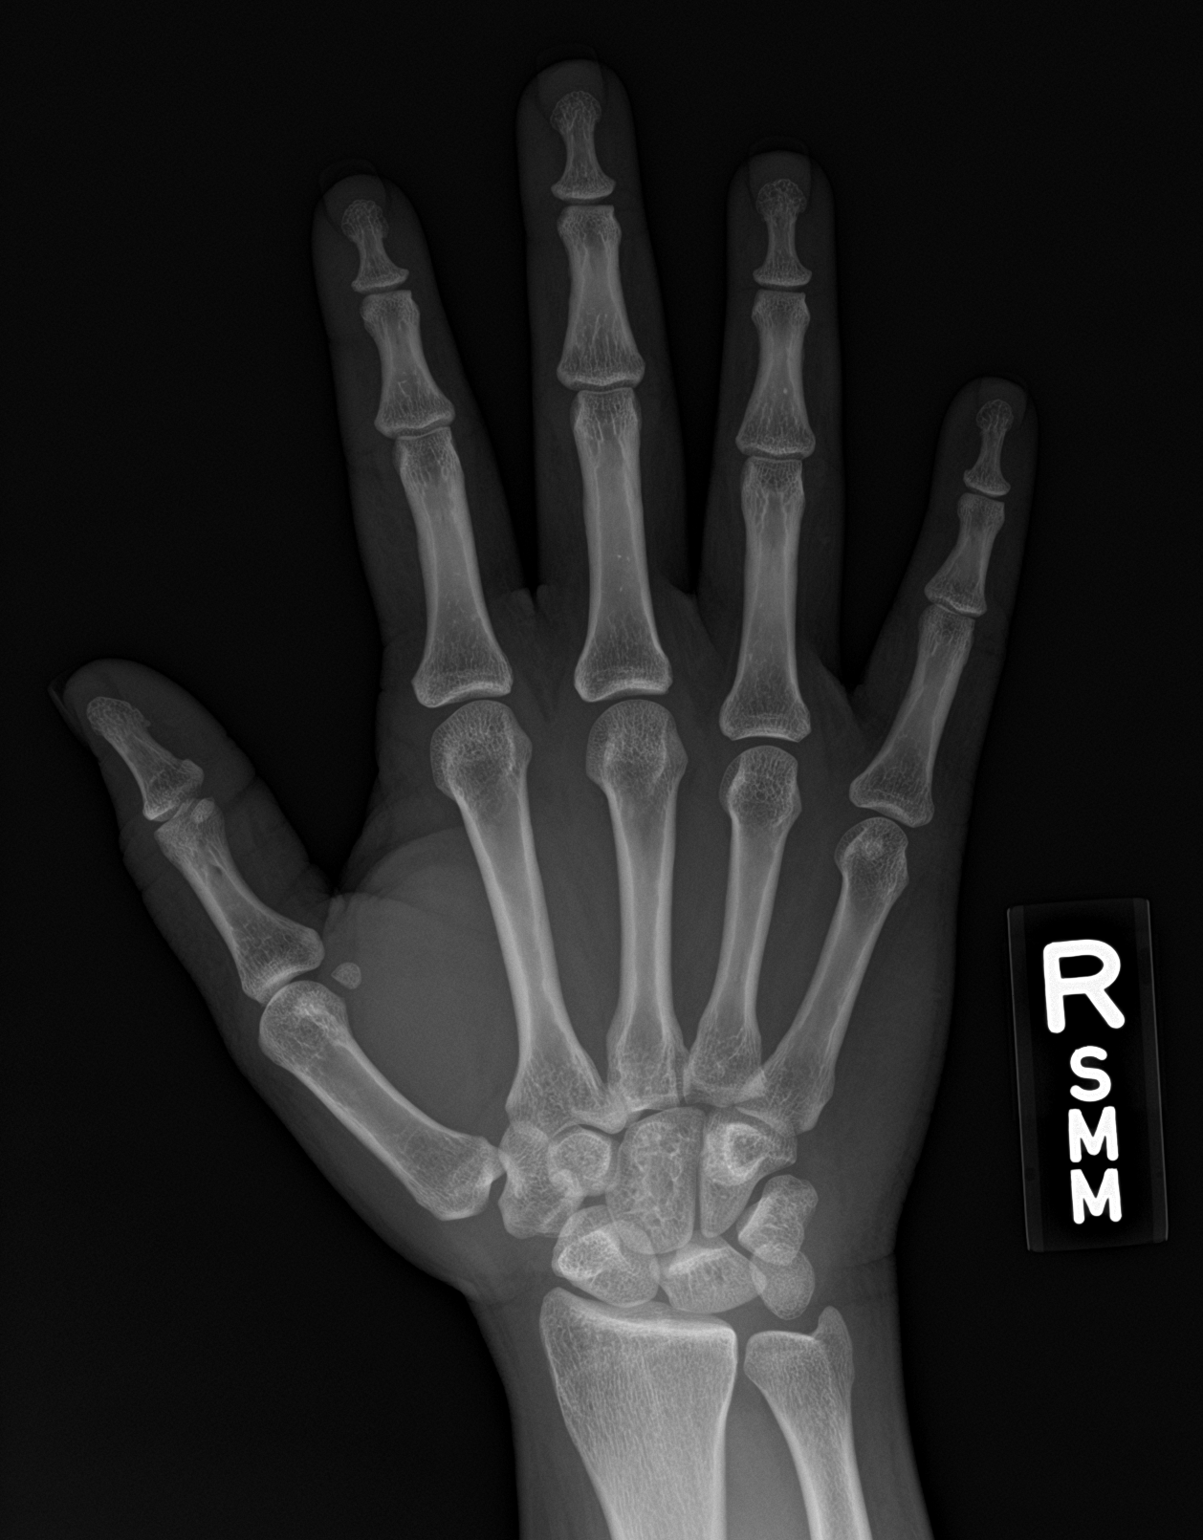

[hand obl]
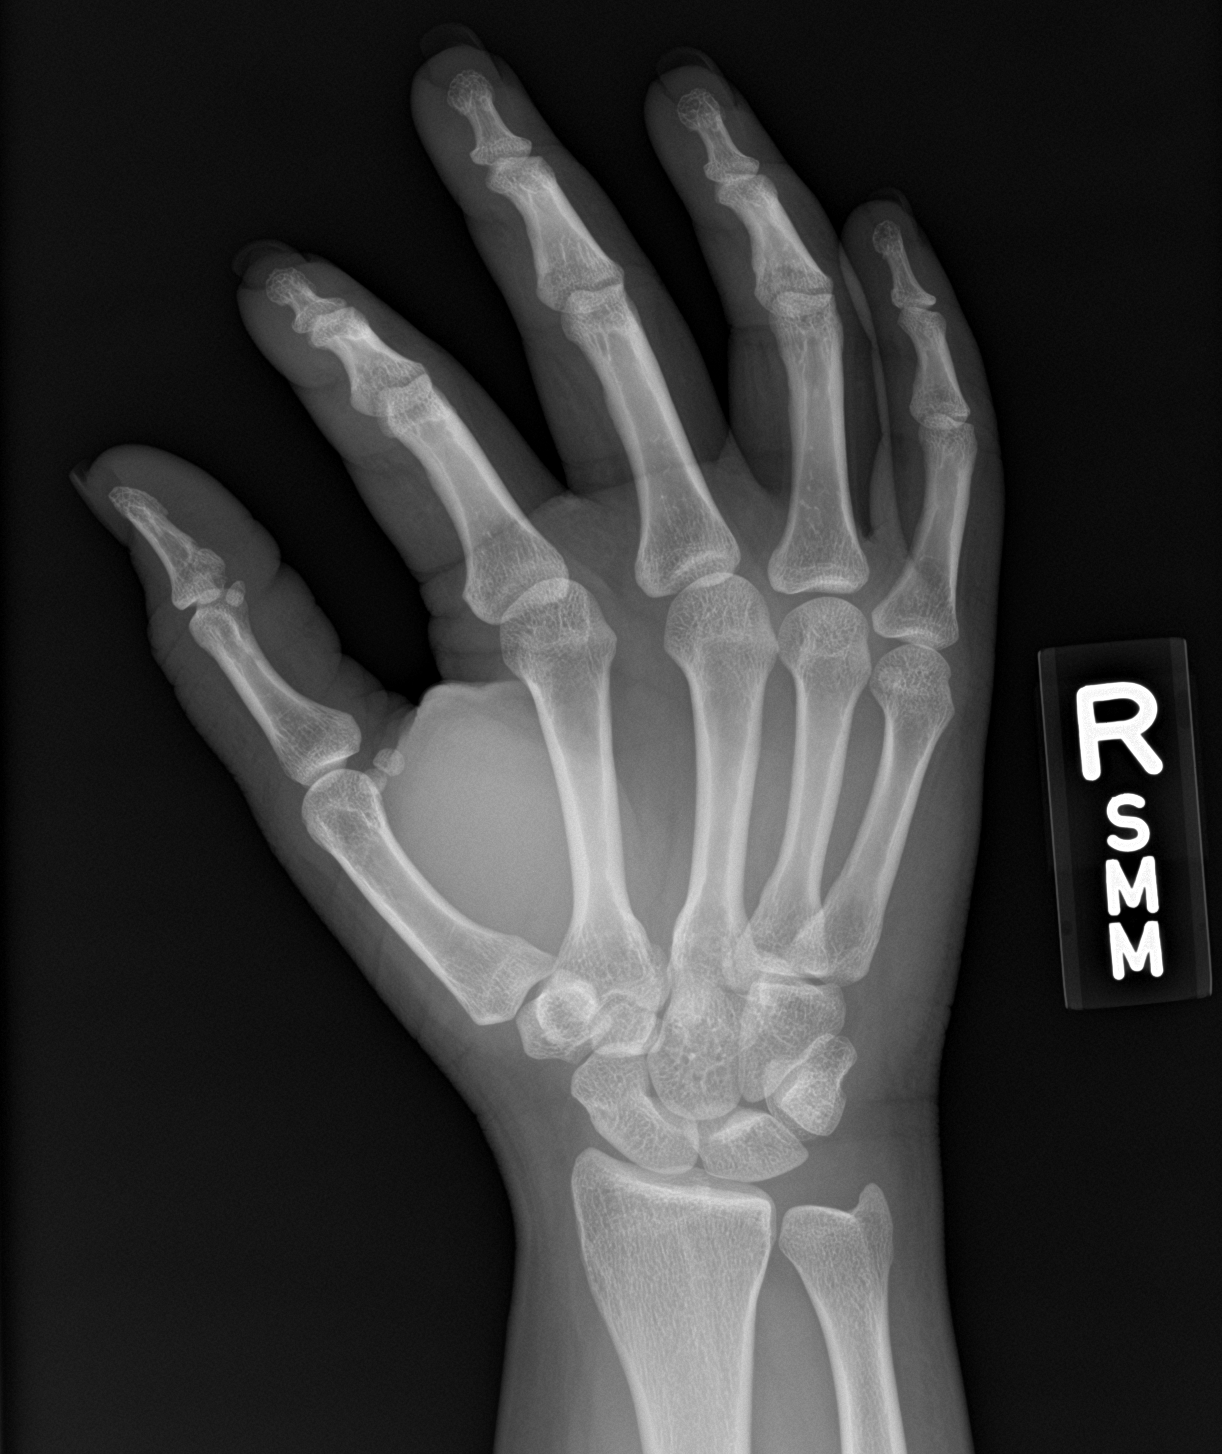

[hand lat]
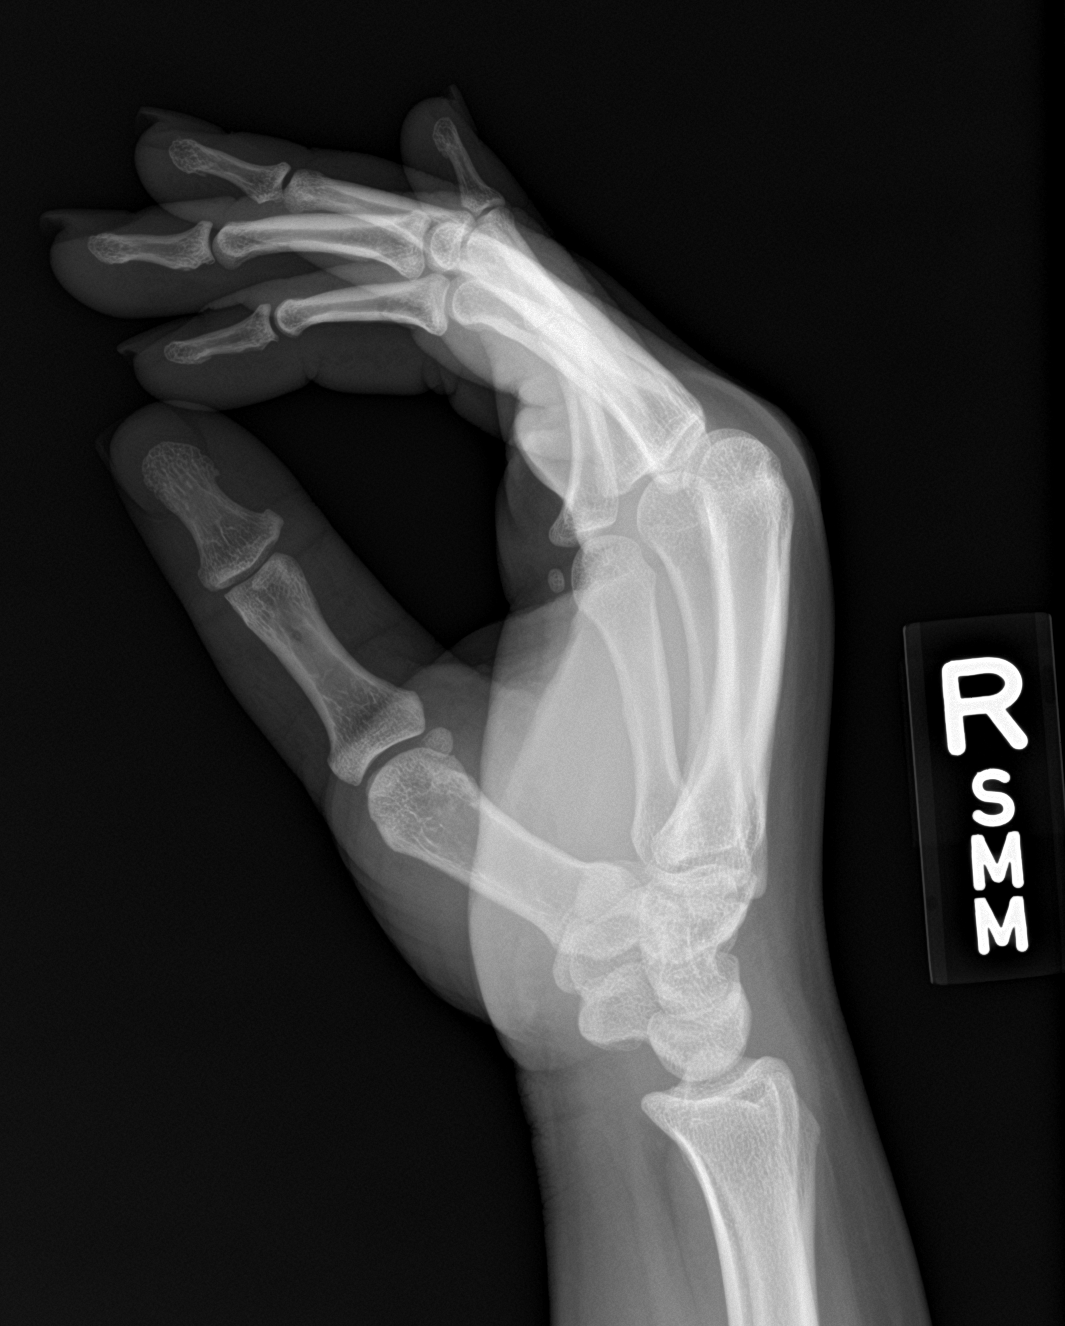

[3 of 3 positions shown; findings below may reference images not displayed]

FINDINGS: Bone mineralization is within normal limits. There is no evidence of
fracture or dislocation. There is no evidence of arthropathy or
other focal bone abnormality. No discrete soft tissue injury. No
soft tissue gas.
IMPRESSION: Negative.

## 2020-10-10 ENCOUNTER — Other Ambulatory Visit: Payer: Self-pay

## 2020-10-10 ENCOUNTER — Ambulatory Visit (INDEPENDENT_AMBULATORY_CARE_PROVIDER_SITE_OTHER): Payer: 59

## 2020-10-10 DIAGNOSIS — J309 Allergic rhinitis, unspecified: Secondary | ICD-10-CM

## 2020-10-17 ENCOUNTER — Ambulatory Visit (INDEPENDENT_AMBULATORY_CARE_PROVIDER_SITE_OTHER): Payer: 59

## 2020-10-17 ENCOUNTER — Other Ambulatory Visit: Payer: Self-pay

## 2020-10-17 DIAGNOSIS — J309 Allergic rhinitis, unspecified: Secondary | ICD-10-CM

## 2020-10-24 ENCOUNTER — Ambulatory Visit (INDEPENDENT_AMBULATORY_CARE_PROVIDER_SITE_OTHER): Payer: 59

## 2020-10-24 ENCOUNTER — Other Ambulatory Visit: Payer: Self-pay

## 2020-10-24 DIAGNOSIS — J309 Allergic rhinitis, unspecified: Secondary | ICD-10-CM | POA: Diagnosis not present

## 2020-10-31 ENCOUNTER — Other Ambulatory Visit: Payer: Self-pay

## 2020-10-31 ENCOUNTER — Ambulatory Visit (INDEPENDENT_AMBULATORY_CARE_PROVIDER_SITE_OTHER): Payer: 59

## 2020-10-31 DIAGNOSIS — J309 Allergic rhinitis, unspecified: Secondary | ICD-10-CM

## 2020-11-06 ENCOUNTER — Ambulatory Visit: Payer: 59 | Admitting: Nurse Practitioner

## 2020-11-07 ENCOUNTER — Ambulatory Visit (INDEPENDENT_AMBULATORY_CARE_PROVIDER_SITE_OTHER): Payer: 59

## 2020-11-07 ENCOUNTER — Other Ambulatory Visit: Payer: Self-pay

## 2020-11-07 DIAGNOSIS — J309 Allergic rhinitis, unspecified: Secondary | ICD-10-CM | POA: Diagnosis not present

## 2020-11-11 ENCOUNTER — Ambulatory Visit: Payer: 59 | Admitting: Nurse Practitioner

## 2020-11-11 ENCOUNTER — Encounter: Payer: Self-pay | Admitting: Nurse Practitioner

## 2020-11-11 ENCOUNTER — Other Ambulatory Visit: Payer: Self-pay

## 2020-11-11 VITALS — BP 138/88 | HR 74 | Temp 97.5°F | Ht 62.0 in | Wt 235.0 lb

## 2020-11-11 DIAGNOSIS — R110 Nausea: Secondary | ICD-10-CM | POA: Insufficient documentation

## 2020-11-11 DIAGNOSIS — Z23 Encounter for immunization: Secondary | ICD-10-CM

## 2020-11-11 DIAGNOSIS — E119 Type 2 diabetes mellitus without complications: Secondary | ICD-10-CM

## 2020-11-11 MED ORDER — ONDANSETRON HCL 4 MG PO TABS: 4.0000 mg | ORAL_TABLET | Freq: Three times a day (TID) | ORAL | 0 refills | Status: AC | PRN

## 2020-11-11 NOTE — Assessment & Plan Note (Signed)
Zofran 4 mg tablet by mouth as needed.

## 2020-11-11 NOTE — Assessment & Plan Note (Addendum)
No changes to current medication , continue diabetic diet, continue metformin.  I added Zofran to help control nausea.  After labs are completed with results, we will discuss Ozempic or Saxenda. Labs completed CBC, CMP lipid panel.  Results pending.

## 2020-11-11 NOTE — Progress Notes (Signed)
Established Patient Office Visit  Subjective:  Patient ID: Pamela Gill, female    DOB: [birth year]  Age: 30 y.o. MRN: [id number]  CC:  Chief Complaint  Patient presents with   Medical Management of Chronic Issues    HPI Pamela Gill presents for The patient presents with history of type 2 diabetes mellitus without complications. Patient was diagnosed in 08/07/2020. Compliance with treatment has been good; the patient takes medication as directed , maintains a diabetic diet and an exercise regimen , follows up as directed , and is keeping a glucose diary. Patient specifically denies associated symptoms, including blurred vision, fatigue, polydipsia, polyphagia and polyuria . Patient denies hypoglycemia. In regard to preventative care, the patient performs foot self-exams daily and last ophthalmology exam was in 2022.   Past Medical History:  Diagnosis Date   Diabetes mellitus without complication (HCC)    PCOS (polycystic ovarian syndrome)    Urticaria     History reviewed. No pertinent surgical history.  Family History  Problem Relation Age of Onset   Hypertension Mother    Asthma Sister    Eczema Brother    Allergic rhinitis Neg Hx    Urticaria Neg Hx     Social History   Socioeconomic History   Marital status: Single    Spouse name: Not on file   Number of children: Not on file   Years of education: Not on file   Highest education level: Not on file  Occupational History   Not on file  Tobacco Use   Smoking status: Never   Smokeless tobacco: Never  Vaping Use   Vaping Use: Never used  Substance and Sexual Activity   Alcohol use: No   Drug use: No   Sexual activity: Yes    Birth control/protection: Pill  Other Topics Concern   Not on file  Social History Narrative   Not on file   Social Determinants of Health   Financial Resource Strain: Not on file  Food Insecurity: Not on file  Transportation Needs: Not on file  Physical Activity: Not on file  Stress:  Not on file  Social Connections: Not on file  Intimate Partner Violence: Not on file    Outpatient Medications Prior to Visit  Medication Sig Dispense Refill   azelastine (ASTELIN) 0.1 % nasal spray Place 1-2 sprays into both nostrils 2 (two) times daily as needed for rhinitis (runny nose). Use in each nostril as directed 30 mL 5   EPINEPHrine 0.3 mg/0.3 mL IJ SOAJ injection Inject 0.3 mg into the muscle as needed for anaphylaxis. 1 each 2   fluticasone (FLONASE) 50 MCG/ACT nasal spray Place 1 spray into both nostrils 2 (two) times daily as needed for allergies or rhinitis (nasal congestion). 16 g 5   KAITLIB FE 0.8-25 MG-MCG tablet      METFORMIN HCL PO Take by mouth.     montelukast (SINGULAIR) 10 MG tablet Take 1 tablet (10 mg total) by mouth at bedtime. 30 tablet 5   norethindrone (MICRONOR) 0.35 MG tablet Take 1 tablet by mouth daily.     Olopatadine HCl 0.2 % SOLN Apply 1 drop to eye daily as needed (itchy/watery eyes). 2.5 mL 5   spironolactone (ALDACTONE) 50 MG tablet Take 50 mg by mouth at bedtime.     No facility-administered medications prior to visit.    Allergies  Allergen Reactions   Penicillins     Reaction unknown    ROS Review of Systems  HENT: Negative.  Eyes: Negative.   Respiratory: Negative.    Cardiovascular: Negative.   Gastrointestinal: Negative.   Musculoskeletal: Negative.   Skin:  Negative for rash.  Neurological: Negative.   Psychiatric/Behavioral: Negative.    All other systems reviewed and are negative.    Objective:    Physical Exam Vitals and nursing note reviewed.  Constitutional:      Appearance: Normal appearance.  HENT:     Head: Normocephalic.     Right Ear: External ear normal.     Nose: Nose normal.     Mouth/Throat:     Mouth: Mucous membranes are moist.     Pharynx: Oropharynx is clear.  Eyes:     Conjunctiva/sclera: Conjunctivae normal.  Cardiovascular:     Rate and Rhythm: Normal rate and regular rhythm.     Pulses:  Normal pulses.  Pulmonary:     Effort: Pulmonary effort is normal.     Breath sounds: Normal breath sounds.  Abdominal:     General: Bowel sounds are normal.  Musculoskeletal:        General: Normal range of motion.  Skin:    General: Skin is warm.     Findings: No rash.  Neurological:     Mental Status: She is alert and oriented to person, place, and time.    BP 138/88   Pulse 74   Temp (!) 97.5 F (36.4 C) (Temporal)   Ht 5' 2" (1.575 m)   Wt 235 lb (106.6 kg)   SpO2 98%   BMI 42.98 kg/m  Wt Readings from Last 3 Encounters:  11/11/20 235 lb (106.6 kg)  08/07/20 241 lb (109.3 kg)  02/20/20 240 lb 12.8 oz (109.2 kg)     Health Maintenance Due  Topic Date Due   FOOT EXAM  Never done   OPHTHALMOLOGY EXAM  Never done   URINE MICROALBUMIN  Never done   HIV Screening  Never done   Hepatitis C Screening  Never done   TETANUS/TDAP  Never done   COVID-19 Vaccine (3 - Booster for Pfizer series) 02/15/2020    There are no preventive care reminders to display for this patient.  No results found for: TSH Lab Results  Component Value Date   WBC 4.9 08/07/2020   HGB 14.3 08/07/2020   HCT 44.3 08/07/2020   MCV 85 08/07/2020   PLT 258 08/07/2020   Lab Results  Component Value Date   NA 138 08/07/2020   K 5.0 08/07/2020   CO2 20 08/07/2020   GLUCOSE 213 (H) 08/07/2020   BUN 11 08/07/2020   CREATININE 0.80 08/07/2020   BILITOT <0.2 08/07/2020   ALKPHOS 87 08/07/2020   AST 15 08/07/2020   ALT 19 08/07/2020   PROT 7.4 08/07/2020   ALBUMIN 4.3 08/07/2020   CALCIUM 9.4 08/07/2020   EGFR 102 08/07/2020   Lab Results  Component Value Date   CHOL 180 08/07/2020   Lab Results  Component Value Date   HDL 42 08/07/2020   Lab Results  Component Value Date   LDLCALC 120 (H) 08/07/2020   Lab Results  Component Value Date   TRIG 100 08/07/2020   Lab Results  Component Value Date   CHOLHDL 4.3 08/07/2020   Lab Results  Component Value Date   HGBA1C 9.8 (H)  08/07/2020      Assessment & Plan:   Problem List Items Addressed This Visit       Endocrine   Diabetes mellitus without complication ([place]) - Primary  No changes to current medication , continue diabetic diet, continue metformin.  I added Zofran to help control nausea.  After labs are completed with results, we will discuss Ozempic or Saxenda. Labs completed CBC, CMP lipid panel.  Results pending.      Relevant Orders   CBC with Differential   Comprehensive metabolic panel   Lipid Panel     Other   Nausea    Zofran 4 mg tablet by mouth as needed.      Relevant Medications   ondansetron (ZOFRAN) 4 MG tablet   Other Visit Diagnoses     Need for immunization against influenza       Relevant Orders   Flu Vaccine QUAD 65moIM (Fluarix, Fluzone & Alfiuria Quad PF) (Completed)       Meds ordered this encounter  Medications   ondansetron (ZOFRAN) 4 MG tablet    Sig: Take 1 tablet (4 mg total) by mouth every 8 (eight) hours as needed for nausea or vomiting.    Dispense:  20 tablet    Refill:  0    Order Specific Question:   Supervising Provider    Answer:   GJanora Norlander[[[id number]   Follow-up: Return in about 3 months (around 02/11/2021), or if symptoms worsen or fail to improve.    OIvy Lynn NP

## 2020-11-11 NOTE — Patient Instructions (Signed)
Diabetes Insipidus Diabetes insipidus (DI) is a rare condition that causes the body to produce more urine than normal. This leads to thirst and low fluid in the body (dehydration). In this condition, the urine is made mostly of water, or dilute urine. DI affects mostly adults, but it can happen at any age. There are four types of DI: Central DI. This is the most common type. Dipsogenic DI. Nephrogenic DI. Gestational DI. The most common forms of this condition are caused by a decrease in the production of the hormone that regulates urine output (antidiuretic hormone), or the body's resistance to this hormone. This condition is not related to type 1 or type 2 diabetes mellitus. What are the causes? Central DI is caused by damage to the pituitary gland or hypothalamus in the brain. Dipsogenic DI is caused by a defect in the thirst mechanism in the brain. This defect causes you to drink too much fluid. These may result from: Brain surgery. Infection. Inflammation. Brain tumor. Head injury. Nephrogenic DI is caused by the kidneys not responding to the antidiuretic hormone in the body. This may result from: Chronic kidney disease (CKD). Certain medicines, such as lithium. Low potassium levels. High calcium levels. Gestational DI is rare and is caused by the antidiuretic hormone that has stopped working properly. What are the signs or symptoms? Symptoms of this condition include: Excessive urination. This means urinating more than 10 cups (2.4 L) during a period of 24 hours. Excessive thirst. Too much nighttime urination (nocturia). Nausea. Diarrhea. How is this diagnosed? This condition may be diagnosed based on: Your medical history. A physical exam. Blood tests. Urine tests. A water deprivation test. During this test, you will stop drinking fluids for a period of time and your blood and urine will be checked regularly. An MRI. How is this treated? Once your specific type of  diabetes insipidus is diagnosed, treatment may include one or more of the following: Increasing or limiting your fluid intake. Taking medicines that contain artificial (synthetic) versions of the antidiuretic hormone. Stopping certain medicines that you take. Correcting the balance of minerals (electrolytes) in your body. Changing your diet. You may be put on a low-protein and low-sodium diet. You may need to visit your health care provider regularly to make sure your condition is being treated properly. You may also need to work with providers who specialize in: Kidney problems (nephrologist). Hormone disorders (endocrinologist). Follow these instructions at home: Eating and drinking Follow instructions from your health care provider about how much fluid and water to drink. You may be directed to drink more fluids and water, or to limit how much fluid and water you drink. Follow instructions from your health care provider about eating or drinking restrictions. General instructions Take over-the-counter and prescription medicines only as told by your health care provider. If directed, monitor your risk of dehydration in extreme heat. Carry a medical alert card or wear medical alert jewelry. Keep all follow-up visits as told by your health care provider. This is important. You may need to visit your health care provider regularly to make sure your condition is being treated properly. Contact a health care provider if: You continue to have symptoms after treatment. Get help right away if: You have extreme thirst. You have symptoms of severe dehydration, such as rapid heart rate, muscle cramps, or confusion. Summary Diabetes insipidus (DI) is a rare condition that causes the body to produce more urine than normal, which leads to thirst and dehydration. Follow instructions from  your health care provider about eating or drinking restrictions. ?Treatment may include increasing or limiting your  fluid intake and correcting the balance of minerals (electrolytes) in your body. ?Get help right away if you have symptoms of severe dehydration, such as rapid heart rate, muscle cramps, or confusion. ?This information is not intended to replace advice given to you by your health care provider. Make sure you discuss any questions you have with your health care provider. ?Document Revised: 03/01/2019 Document Reviewed: 03/01/2019 ?Elsevier Patient Education ? 2022 [employer] ? ?

## 2020-11-19 ENCOUNTER — Ambulatory Visit (INDEPENDENT_AMBULATORY_CARE_PROVIDER_SITE_OTHER): Payer: 59

## 2020-11-19 ENCOUNTER — Other Ambulatory Visit: Payer: Self-pay

## 2020-11-19 DIAGNOSIS — J309 Allergic rhinitis, unspecified: Secondary | ICD-10-CM | POA: Diagnosis not present

## 2020-11-28 ENCOUNTER — Ambulatory Visit (INDEPENDENT_AMBULATORY_CARE_PROVIDER_SITE_OTHER): Payer: 59

## 2020-11-28 ENCOUNTER — Other Ambulatory Visit: Payer: Self-pay

## 2020-11-28 DIAGNOSIS — J309 Allergic rhinitis, unspecified: Secondary | ICD-10-CM

## 2020-12-04 ENCOUNTER — Telehealth: Payer: Self-pay | Admitting: Nurse Practitioner

## 2020-12-04 NOTE — Telephone Encounter (Signed)
Please send medical record of flu shot for patient.

## 2020-12-04 NOTE — Telephone Encounter (Signed)
done

## 2020-12-05 ENCOUNTER — Other Ambulatory Visit: Payer: Self-pay

## 2020-12-05 ENCOUNTER — Ambulatory Visit (INDEPENDENT_AMBULATORY_CARE_PROVIDER_SITE_OTHER): Payer: 59

## 2020-12-05 DIAGNOSIS — J309 Allergic rhinitis, unspecified: Secondary | ICD-10-CM | POA: Diagnosis not present

## 2020-12-11 NOTE — Progress Notes (Signed)
Follow Up Note  RE: Pamela Gill MRN: [id number] DOB: 1990-11-11 Date of Office Visit: 12/12/2020  Referring provider: Daryll Drown, NP Primary care provider: Daryll Drown, NP  Chief Complaint: Urticaria (On the scalp--3 to 4 weeks--noticed bumps )  History of Present Illness: I had the pleasure of seeing Pamela Gill for a follow up visit at the [facility] on 12/12/2020. She is a 30 y.o. female, who is being followed for allergic rhinoconjunctivitis on AIT. Her previous allergy office visit was on 06/20/2020 with Dr. Selena Batten. Today is a new complaint visit of bumps on the back of the head .  Patient noticed scalp irritation for the past 3-4 weeks. Describes them as itchy, painful, red. No oozing.  Not worsening but not going away.  No prior rash like this.  Denies any changes in personal care products.  Seasonal and perennial allergic rhinoconjunctivitis Allergy injections going well with some localized reactions. Currently taking Xyzal 5mg  daily with good benefit. Only using Singulair prn. No eye drop use.  Sometimes uses Flonase and azelastine for sinus headaches with good benefit.   Reviewed images on the phone.  Assessment and Plan: Pamela Gill is a 30 y.o. female with: Scalp irritation Posterior scalp irritation for 1 month. Denies changes in personal care products, recent travel or issues with insect/bug infestations.  See below for proper skin care - use fragrance free and dye free products.  Derma smoothe oil - Apply to the scalp daily at night as needed on the rash - leave on overnight or greater than 4 hours before washing.   Seasonal and perennial allergic rhinoconjunctivitis Past history - Rhino conjunctivitis symptoms worsening the past 5-6 months. Usually only flares during the summer months and had skin testing as a child which was positive to grass, cat and dust per patient report. 2021 skin testing showed: Positive to grass, weed, trees, dust  mites, dog. Started AIT on 03/26/2020 (G-T & DM-D) Interim history - improved headaches, localized itching with AIT. Continue allergy injections - given today.  Continue environmental control measures as below. May use over the counter antihistamines such as Zyrtec (cetirizine), Claritin (loratadine), Allegra (fexofenadine), or Xyzal (levocetirizine) daily as needed. May take twice a day during flares. Use Flonase (fluticasone) nasal spray 1 spray per nostril twice a day as needed for nasal congestion.  Use azelastine nasal spray 1-2 sprays per nostril twice a day as needed for runny nose/drainage. Nasal saline spray (i.e., Simply Saline) or nasal saline lavage (i.e., NeilMed) is recommended as needed and prior to medicated nasal sprays. May use olopatadine eye drops 0.2% once a day as needed for itchy/watery eyes. Continue Singulair (montelukast) 10mg  daily at night.  Return in about 6 months (around 06/11/2021).  Meds ordered this encounter  Medications   Fluocinolone Acetonide Scalp (DERMA-SMOOTHE/FS SCALP) 0.01 % OIL    Sig: Apply to the scalp daily at night as needed on the rash - leave on overnight or greater than 4 hours before washing.    Dispense:  118 mL    Refill:  1    Lab Orders  No laboratory test(s) ordered today    Diagnostics: None.  Medication List:  Current Outpatient Medications  Medication Sig Dispense Refill   azelastine (ASTELIN) 0.1 % nasal spray Place 1-2 sprays into both nostrils 2 (two) times daily as needed for rhinitis (runny nose). Use in each nostril as directed 30 mL 5   EPINEPHrine 0.3 mg/0.3 mL IJ SOAJ injection Inject  0.3 mg into the muscle as needed for anaphylaxis. 1 each 2   Fluocinolone Acetonide Scalp (DERMA-SMOOTHE/FS SCALP) 0.01 % OIL Apply to the scalp daily at night as needed on the rash - leave on overnight or greater than 4 hours before washing. 118 mL 1   fluticasone (FLONASE) 50 MCG/ACT nasal spray Place 1 spray into both nostrils 2 (two)  times daily as needed for allergies or rhinitis (nasal congestion). 16 g 5   KAITLIB FE 0.8-25 MG-MCG tablet      METFORMIN HCL PO Take by mouth.     montelukast (SINGULAIR) 10 MG tablet Take 1 tablet (10 mg total) by mouth at bedtime. 30 tablet 5   norethindrone (MICRONOR) 0.35 MG tablet Take 1 tablet by mouth daily.     Olopatadine HCl 0.2 % SOLN Apply 1 drop to eye daily as needed (itchy/watery eyes). 2.5 mL 5   ondansetron (ZOFRAN) 4 MG tablet Take 1 tablet (4 mg total) by mouth every 8 (eight) hours as needed for nausea or vomiting. 20 tablet 0   spironolactone (ALDACTONE) 50 MG tablet Take 50 mg by mouth at bedtime.     No current facility-administered medications for this visit.   Allergies: Allergies  Allergen Reactions   Penicillins     Reaction unknown   I reviewed her past medical history, social history, family history, and environmental history and no significant changes have been reported from her previous visit.  Review of Systems  Constitutional:  Negative for appetite change, chills, fever and unexpected weight change.  HENT:  Positive for sinus pressure and sneezing. Negative for congestion, postnasal drip and rhinorrhea.   Eyes:  Negative for itching.  Respiratory:  Negative for cough, chest tightness, shortness of breath and wheezing.   Cardiovascular:  Negative for chest pain.  Gastrointestinal:  Negative for abdominal pain.  Genitourinary:  Negative for difficulty urinating.  Skin:  Positive for rash.  Allergic/Immunologic: Positive for environmental allergies.   Objective: BP 130/84   Pulse 88   Temp 97.9 F (36.6 C) (Temporal)   Resp 16   Ht 5\' 2"  (1.575 m)   Wt 234 lb 4 oz (106.3 kg)   SpO2 98%   BMI 42.84 kg/m  Body mass index is 42.84 kg/m. Physical Exam Vitals and nursing note reviewed.  Constitutional:      Appearance: Normal appearance. She is well-developed.  HENT:     Head: Normocephalic and atraumatic.     Right Ear: Tympanic membrane  and external ear normal.     Left Ear: Tympanic membrane and external ear normal.     Nose: Nose normal.     Mouth/Throat:     Mouth: Mucous membranes are moist.     Pharynx: Oropharynx is clear.  Eyes:     Conjunctiva/sclera: Conjunctivae normal.  Cardiovascular:     Rate and Rhythm: Normal rate and regular rhythm.     Heart sounds: Normal heart sounds. No murmur heard.   No friction rub. No gallop.  Pulmonary:     Effort: Pulmonary effort is normal.     Breath sounds: Normal breath sounds. No wheezing, rhonchi or rales.  Musculoskeletal:     Cervical back: Neck supple.  Skin:    General: Skin is warm.     Findings: No rash.     Comments: Hyperpigmented circular areas on posterior scalp area.  Neurological:     Mental Status: She is alert and oriented to person, place, and time.  Psychiatric:  Behavior: Behavior normal.   Previous notes and tests were reviewed. The plan was reviewed with the patient/family, and all questions/concerned were addressed.  It was my pleasure to see Pamela Gill today and participate in her care. Please feel free to contact me with any questions or concerns.  Sincerely,  Wyline Mood, DO Allergy & Immunology  [facility] office: [phone] [facility] office: [phone]

## 2020-12-12 ENCOUNTER — Other Ambulatory Visit: Payer: Self-pay

## 2020-12-12 ENCOUNTER — Encounter: Payer: Self-pay | Admitting: Allergy

## 2020-12-12 ENCOUNTER — Ambulatory Visit (INDEPENDENT_AMBULATORY_CARE_PROVIDER_SITE_OTHER): Payer: 59 | Admitting: Allergy

## 2020-12-12 VITALS — BP 130/84 | HR 88 | Temp 97.9°F | Resp 16 | Ht 62.0 in | Wt 234.2 lb

## 2020-12-12 DIAGNOSIS — J309 Allergic rhinitis, unspecified: Secondary | ICD-10-CM

## 2020-12-12 DIAGNOSIS — J3089 Other allergic rhinitis: Secondary | ICD-10-CM

## 2020-12-12 DIAGNOSIS — H1013 Acute atopic conjunctivitis, bilateral: Secondary | ICD-10-CM | POA: Diagnosis not present

## 2020-12-12 DIAGNOSIS — R238 Other skin changes: Secondary | ICD-10-CM | POA: Diagnosis not present

## 2020-12-12 DIAGNOSIS — H1010 Acute atopic conjunctivitis, unspecified eye: Secondary | ICD-10-CM

## 2020-12-12 DIAGNOSIS — J302 Other seasonal allergic rhinitis: Secondary | ICD-10-CM

## 2020-12-12 MED ORDER — FLUOCINOLONE ACETONIDE SCALP 0.01 % EX OIL: TOPICAL_OIL | CUTANEOUS | 1 refills | Status: AC

## 2020-12-12 NOTE — Patient Instructions (Addendum)
Scalp irritation: See below for proper skin care.  Derma smoothe oil - Apply to the scalp daily at night as needed on the rash - leave on overnight or greater than 4 hours before washing.   Environmental allergies 2021 skin testing showed: Positive to grass, weed, trees, dust mites, dog. Continue allergy injections - given today.  Continue environmental control measures as below. May use over the counter antihistamines such as Zyrtec (cetirizine), Claritin (loratadine), Allegra (fexofenadine), or Xyzal (levocetirizine) daily as needed. May take twice a day during flares. Use Flonase (fluticasone) nasal spray 1 spray per nostril twice a day as needed for nasal congestion.  Use azelastine nasal spray 1-2 sprays per nostril twice a day as needed for runny nose/drainage. Nasal saline spray (i.e., Simply Saline) or nasal saline lavage (i.e., NeilMed) is recommended as needed and prior to medicated nasal sprays. May use olopatadine eye drops 0.2% once a day as needed for itchy/watery eyes. Continue Singulair (montelukast) 10mg  daily at night.  Follow up in 6 months or sooner if needed.   Skin care recommendations  Bath time: Always use lukewarm water. AVOID very hot or cold water. Keep bathing time to 5-10 minutes. Do NOT use bubble bath. Use a mild soap and use just enough to wash the dirty areas. Do NOT scrub skin vigorously.  After bathing, pat dry your skin with a towel. Do NOT rub or scrub the skin.  Moisturizers and prescriptions:  ALWAYS apply moisturizers immediately after bathing (within 3 minutes). This helps to lock-in moisture. Use the moisturizer several times a day over the whole body. Good summer moisturizers include: Aveeno, CeraVe, Cetaphil. Good winter moisturizers include: Aquaphor, Vaseline, Cerave, Cetaphil, Eucerin, Vanicream. When using moisturizers along with medications, the moisturizer should be applied about one hour after applying the medication to prevent  diluting effect of the medication or moisturize around where you applied the medications. When not using medications, the moisturizer can be continued twice daily as maintenance.  Laundry and clothing: Avoid laundry products with added color or perfumes. Use unscented hypo-allergenic laundry products such as Tide free, Cheer free & gentle, and All free and clear.  If the skin still seems dry or sensitive, you can try double-rinsing the clothes. Avoid tight or scratchy clothing such as wool. Do not use fabric softeners or dyer sheets.

## 2020-12-12 NOTE — Assessment & Plan Note (Signed)
Past history - Rhino conjunctivitis symptoms worsening the past 5-6 months. Usually only flares during the summer months and had skin testing as a child which was positive to grass, cat and dust per patient report. 2021 skin testing showed: Positive to grass, weed, trees, dust mites, dog. Started AIT on 03/26/2020 (G-T & DM-D) Interim history - improved headaches, localized itching with AIT.  Continue allergy injections - given today.   Continue environmental control measures as below.  May use over the counter antihistamines such as Zyrtec (cetirizine), Claritin (loratadine), Allegra (fexofenadine), or Xyzal (levocetirizine) daily as needed. May take twice a day during flares. . Use Flonase (fluticasone) nasal spray 1 spray per nostril twice a day as needed for nasal congestion.   Use azelastine nasal spray 1-2 sprays per nostril twice a day as needed for runny nose/drainage.  Nasal saline spray (i.e., Simply Saline) or nasal saline lavage (i.e., NeilMed) is recommended as needed and prior to medicated nasal sprays. . May use olopatadine eye drops 0.2% once a day as needed for itchy/watery eyes. . Continue Singulair (montelukast) 10mg  daily at night.

## 2020-12-12 NOTE — Assessment & Plan Note (Signed)
Posterior scalp irritation for 1 month. Denies changes in personal care products, recent travel or issues with insect/bug infestations.  . See below for proper skin care - use fragrance free and dye free products.  Horton Marshall smoothe oil - Apply to the scalp daily at night as needed on the rash - leave on overnight or greater than 4 hours before washing.

## 2020-12-19 ENCOUNTER — Ambulatory Visit (INDEPENDENT_AMBULATORY_CARE_PROVIDER_SITE_OTHER): Payer: 59

## 2020-12-19 ENCOUNTER — Other Ambulatory Visit: Payer: Self-pay

## 2020-12-19 DIAGNOSIS — J309 Allergic rhinitis, unspecified: Secondary | ICD-10-CM

## 2021-01-09 ENCOUNTER — Other Ambulatory Visit: Payer: Self-pay

## 2021-01-09 ENCOUNTER — Ambulatory Visit (INDEPENDENT_AMBULATORY_CARE_PROVIDER_SITE_OTHER): Payer: 59

## 2021-01-09 DIAGNOSIS — J309 Allergic rhinitis, unspecified: Secondary | ICD-10-CM

## 2021-01-16 ENCOUNTER — Other Ambulatory Visit: Payer: Self-pay

## 2021-01-16 ENCOUNTER — Ambulatory Visit (INDEPENDENT_AMBULATORY_CARE_PROVIDER_SITE_OTHER): Payer: 59

## 2021-01-16 DIAGNOSIS — J309 Allergic rhinitis, unspecified: Secondary | ICD-10-CM | POA: Diagnosis not present

## 2021-01-23 ENCOUNTER — Ambulatory Visit (INDEPENDENT_AMBULATORY_CARE_PROVIDER_SITE_OTHER): Payer: 59

## 2021-01-23 ENCOUNTER — Other Ambulatory Visit: Payer: Self-pay

## 2021-01-23 DIAGNOSIS — J309 Allergic rhinitis, unspecified: Secondary | ICD-10-CM | POA: Diagnosis not present

## 2021-01-29 DIAGNOSIS — J3089 Other allergic rhinitis: Secondary | ICD-10-CM | POA: Diagnosis not present

## 2021-01-29 NOTE — Progress Notes (Signed)
VIALS MADE. EXP 01-29-22 °

## 2021-01-30 ENCOUNTER — Ambulatory Visit (INDEPENDENT_AMBULATORY_CARE_PROVIDER_SITE_OTHER): Payer: 59

## 2021-01-30 ENCOUNTER — Other Ambulatory Visit: Payer: Self-pay

## 2021-01-30 DIAGNOSIS — J309 Allergic rhinitis, unspecified: Secondary | ICD-10-CM | POA: Diagnosis not present

## 2021-02-11 ENCOUNTER — Ambulatory Visit: Payer: 59 | Admitting: Nurse Practitioner

## 2021-02-12 ENCOUNTER — Encounter: Payer: Self-pay | Admitting: Nurse Practitioner

## 2021-02-13 ENCOUNTER — Ambulatory Visit (INDEPENDENT_AMBULATORY_CARE_PROVIDER_SITE_OTHER): Payer: 59

## 2021-02-13 ENCOUNTER — Other Ambulatory Visit: Payer: Self-pay

## 2021-02-13 DIAGNOSIS — J309 Allergic rhinitis, unspecified: Secondary | ICD-10-CM

## 2021-02-21 ENCOUNTER — Other Ambulatory Visit: Payer: Self-pay | Admitting: Nurse Practitioner

## 2021-02-21 DIAGNOSIS — E119 Type 2 diabetes mellitus without complications: Secondary | ICD-10-CM

## 2021-02-21 MED ORDER — METFORMIN HCL 500 MG PO TABS: 500.0000 mg | ORAL_TABLET | Freq: Two times a day (BID) | ORAL | 3 refills | Status: AC

## 2021-02-21 NOTE — Progress Notes (Signed)
Started patient back on metformin. Follow up in 3 months.

## 2021-03-06 ENCOUNTER — Ambulatory Visit (INDEPENDENT_AMBULATORY_CARE_PROVIDER_SITE_OTHER): Payer: 59

## 2021-03-06 ENCOUNTER — Other Ambulatory Visit: Payer: Self-pay

## 2021-03-06 DIAGNOSIS — J309 Allergic rhinitis, unspecified: Secondary | ICD-10-CM | POA: Diagnosis not present

## 2021-03-13 ENCOUNTER — Ambulatory Visit (INDEPENDENT_AMBULATORY_CARE_PROVIDER_SITE_OTHER): Payer: 59

## 2021-03-13 ENCOUNTER — Other Ambulatory Visit: Payer: Self-pay

## 2021-03-13 DIAGNOSIS — J309 Allergic rhinitis, unspecified: Secondary | ICD-10-CM

## 2021-03-27 ENCOUNTER — Other Ambulatory Visit: Payer: Self-pay

## 2021-03-27 ENCOUNTER — Ambulatory Visit (INDEPENDENT_AMBULATORY_CARE_PROVIDER_SITE_OTHER): Payer: 59 | Admitting: *Deleted

## 2021-03-27 DIAGNOSIS — J309 Allergic rhinitis, unspecified: Secondary | ICD-10-CM

## 2021-06-11 NOTE — Progress Notes (Deleted)
Follow Up Note  RE: Pamela Gill MRN: [id number] DOB: 18-Jul-1990 Date of Office Visit: 06/12/2021  Referring provider: Daryll DrownIjaola, Onyeje M, NP Primary care provider: Daryll DrownIjaola, Onyeje M, NP  Chief Complaint: No chief complaint on file.  History of Present Illness: I had the pleasure of seeing Pamela Gill for a follow up visit at the [facility] on 06/11/2021. She is a 31 y.o. female, who is being followed for allergic rhinoconjunctivitis on AIT and scalp irritation. Her previous allergy office visit was on 12/12/2020 with Dr. Selena BattenKim. Today is a regular follow up visit.  Scalp irritation Posterior scalp irritation for 1 month. Denies changes in personal care products, recent travel or issues with insect/bug infestations.  See below for proper skin care - use fragrance free and dye free products.  Derma smoothe oil - Apply to the scalp daily at night as needed on the rash - leave on overnight or greater than 4 hours before washing.    Seasonal and perennial allergic rhinoconjunctivitis Past history - Rhino conjunctivitis symptoms worsening the past 5-6 months. Usually only flares during the summer months and had skin testing as a child which was positive to grass, cat and dust per patient report. 2021 skin testing showed: Positive to grass, weed, trees, dust mites, dog. Started AIT on 03/26/2020 (G-T & DM-D) Interim history - improved headaches, localized itching with AIT. Continue allergy injections - given today.  Continue environmental control measures as below. May use over the counter antihistamines such as Zyrtec (cetirizine), Claritin (loratadine), Allegra (fexofenadine), or Xyzal (levocetirizine) daily as needed. May take twice a day during flares. Use Flonase (fluticasone) nasal spray 1 spray per nostril twice a day as needed for nasal congestion.  Use azelastine nasal spray 1-2 sprays per nostril twice a day as needed for runny nose/drainage. Nasal saline spray (i.e., Simply  Saline) or nasal saline lavage (i.e., NeilMed) is recommended as needed and prior to medicated nasal sprays. May use olopatadine eye drops 0.2% once a day as needed for itchy/watery eyes. Continue Singulair (montelukast) 10mg  daily at night.   Return in about 6 months (around 06/11/2021).    Assessment and Plan: Pamela Gill is a 31 y.o. female with: No problem-specific Assessment & Plan notes found for this encounter.  No follow-ups on file.  No orders of the defined types were placed in this encounter.  Lab Orders  No laboratory test(s) ordered today    Diagnostics: Spirometry:  Tracings reviewed. Her effort: {Blank single:19197::"Good reproducible efforts.","It was hard to get consistent efforts and there is a question as to whether this reflects a maximal maneuver.","Poor effort, data can not be interpreted."} FVC: ***L FEV1: ***L, ***% predicted FEV1/FVC ratio: ***% Interpretation: {Blank single:19197::"Spirometry consistent with mild obstructive disease","Spirometry consistent with moderate obstructive disease","Spirometry consistent with severe obstructive disease","Spirometry consistent with possible restrictive disease","Spirometry consistent with mixed obstructive and restrictive disease","Spirometry uninterpretable due to technique","Spirometry consistent with normal pattern","No overt abnormalities noted given today's efforts"}.  Please see scanned spirometry results for details.  Skin Testing: {Blank single:19197::"Select foods","Environmental allergy panel","Environmental allergy panel and select foods","Food allergy panel","None","Deferred due to recent antihistamines use"}. *** Results discussed with patient/family.   Medication List:  Current Outpatient Medications  Medication Sig Dispense Refill   azelastine (ASTELIN) 0.1 % nasal spray Place 1-2 sprays into both nostrils 2 (two) times daily as needed for rhinitis (runny nose). Use in each nostril as directed 30 mL 5    EPINEPHrine 0.3 mg/0.3 mL IJ SOAJ injection Inject 0.3 mg  into the muscle as needed for anaphylaxis. 1 each 2   Fluocinolone Acetonide Scalp (DERMA-SMOOTHE/FS SCALP) 0.01 % OIL Apply to the scalp daily at night as needed on the rash - leave on overnight or greater than 4 hours before washing. 118 mL 1   fluticasone (FLONASE) 50 MCG/ACT nasal spray Place 1 spray into both nostrils 2 (two) times daily as needed for allergies or rhinitis (nasal congestion). 16 g 5   KAITLIB FE 0.8-25 MG-MCG tablet      metFORMIN (GLUCOPHAGE) 500 MG tablet Take 1 tablet (500 mg total) by mouth 2 (two) times daily with a meal. 180 tablet 3   montelukast (SINGULAIR) 10 MG tablet Take 1 tablet (10 mg total) by mouth at bedtime. 30 tablet 5   norethindrone (MICRONOR) 0.35 MG tablet Take 1 tablet by mouth daily.     Olopatadine HCl 0.2 % SOLN Apply 1 drop to eye daily as needed (itchy/watery eyes). 2.5 mL 5   ondansetron (ZOFRAN) 4 MG tablet Take 1 tablet (4 mg total) by mouth every 8 (eight) hours as needed for nausea or vomiting. 20 tablet 0   spironolactone (ALDACTONE) 50 MG tablet Take 50 mg by mouth at bedtime.     No current facility-administered medications for this visit.   Allergies: Allergies  Allergen Reactions   Penicillins     Reaction unknown   I reviewed her past medical history, social history, family history, and environmental history and no significant changes have been reported from her previous visit.  Review of Systems  Constitutional:  Negative for appetite change, chills, fever and unexpected weight change.  HENT:  Positive for sinus pressure and sneezing. Negative for congestion, postnasal drip and rhinorrhea.   Eyes:  Negative for itching.  Respiratory:  Negative for cough, chest tightness, shortness of breath and wheezing.   Cardiovascular:  Negative for chest pain.  Gastrointestinal:  Negative for abdominal pain.  Genitourinary:  Negative for difficulty urinating.  Skin:  Positive for  rash.  Allergic/Immunologic: Positive for environmental allergies.   Objective: There were no vitals taken for this visit. There is no height or weight on file to calculate BMI. Physical Exam Vitals and nursing note reviewed.  Constitutional:      Appearance: Normal appearance. She is well-developed.  HENT:     Head: Normocephalic and atraumatic.     Right Ear: Tympanic membrane and external ear normal.     Left Ear: Tympanic membrane and external ear normal.     Nose: Nose normal.     Mouth/Throat:     Mouth: Mucous membranes are moist.     Pharynx: Oropharynx is clear.  Eyes:     Conjunctiva/sclera: Conjunctivae normal.  Cardiovascular:     Rate and Rhythm: Normal rate and regular rhythm.     Heart sounds: Normal heart sounds. No murmur heard.   No friction rub. No gallop.  Pulmonary:     Effort: Pulmonary effort is normal.     Breath sounds: Normal breath sounds. No wheezing, rhonchi or rales.  Musculoskeletal:     Cervical back: Neck supple.  Skin:    General: Skin is warm.     Findings: No rash.     Comments: Hyperpigmented circular areas on posterior scalp area.  Neurological:     Mental Status: She is alert and oriented to person, place, and time.  Psychiatric:        Behavior: Behavior normal.   Previous notes and tests were reviewed. The plan was reviewed  with the patient/family, and all questions/concerned were addressed.  It was my pleasure to see Pamela Gill today and participate in her care. Please feel free to contact me with any questions or concerns.  Sincerely,  Wyline Mood, DO Allergy & Immunology  [facility] office: [phone] [facility] office: [phone]

## 2021-06-12 ENCOUNTER — Ambulatory Visit: Payer: 59 | Admitting: Allergy

## 2021-06-12 DIAGNOSIS — J302 Other seasonal allergic rhinitis: Secondary | ICD-10-CM

## 2021-06-12 DIAGNOSIS — J309 Allergic rhinitis, unspecified: Secondary | ICD-10-CM

## 2023-06-15 DIAGNOSIS — E1165 Type 2 diabetes mellitus with hyperglycemia: Secondary | ICD-10-CM | POA: Insufficient documentation

## 2023-12-15 ENCOUNTER — Ambulatory Visit: Admitting: Neurology

## 2023-12-19 NOTE — Progress Notes (Unsigned)
 [facility]  PATIENT: Zanaya Baize DOB: 05/29/1990  REFERRING DOCTOR OR PCP:  Gerard Shope, DO SOURCE: Patient, notes from [facility], imaging and lab results, CT scan images personally reviewed.  _________________________________   HISTORICAL  CHIEF COMPLAINT:  Chief Complaint  Patient presents with   RM11/HEADACHES    Pt is here Alone. Pt states that her face pain varies from sides. Pt states that when she gets a bad headache she will feel the pain in her jaw.     HISTORY OF PRESENT ILLNESS:  I had the pleasure of seeing your patient, Kilyn Maragh, at [facility] for neurologic consultation regarding her headaches.  She is a 33 year old woman who has had issues with bilateral facial pain.   Pain is in the face left = right, though may start on one side, and sometimes in the temples or in the eyes.  Pain is usually better when she wakes up but worse as the dy goes on.  Pain can be pressure like.  A typical headache lasts 3 days and occurs every 1-2 months.  She also gets other HA just lasting hours to one day.   She denies nausea/vomiting.  No significant photophobia or phonophobia.  However if eyes are hurting looking at the TV or phone is more painful.    Excedrin migraine helps but Tylenol does not.     Because symptoms seemed worse when a front went through and she noted some ear itching and nasal drainage, she has a h/o sinusitis and allergies so saw ENT.   A CT sinus showed mild chronic sinusitis.      She has NIDDM T2, HTN and seasonal allergies   Imaging: CT scan of the paranasal sinuses 06/23/2023 showed mucoperiosteal thickening inferiorly within the maxillary sinuses and within some of the ethmoid air cells.  REVIEW OF SYSTEMS: Constitutional: No fevers, chills, sweats, or change in appetite Eyes: No visual changes, double vision, eye pain Ear, nose and throat: No hearing loss, ear pain, nasal congestion, sore  throat Cardiovascular: No chest pain, palpitations Respiratory:  No shortness of breath at rest or with exertion.   No wheezes GastrointestinaI: No nausea, vomiting, diarrhea, abdominal pain, fecal incontinence Genitourinary:  No dysuria, urinary retention or frequency.  No nocturia. Musculoskeletal:  No neck pain, back pain Integumentary: No rash, pruritus, skin lesions Neurological: as above Psychiatric: No depression at this time.  No anxiety Endocrine: No palpitations, diaphoresis, change in appetite, change in weigh or increased thirst.  Well-controlled type 2 diabetes mellitus. Hematologic/Lymphatic:  No anemia, purpura, petechiae. Allergic/Immunologic: Has seasonal allergies minimal chronic sinusitis.  ALLERGIES: Allergies  Allergen Reactions   Penicillins     Reaction unknown    HOME MEDICATIONS:  Current Outpatient Medications:    azelastine  (ASTELIN ) 0.1 % nasal spray, Place 1-2 sprays into both nostrils 2 (two) times daily as needed for rhinitis (runny nose). Use in each nostril as directed, Disp: 30 mL, Rfl: 5   Fluocinolone  Acetonide Scalp (DERMA-SMOOTHE /FS SCALP) 0.01 % OIL, Apply to the scalp daily at night as needed on the rash - leave on overnight or greater than 4 hours before washing., Disp: 118 mL, Rfl: 1   fluticasone  (FLONASE ) 50 MCG/ACT nasal spray, Place 1 spray into both nostrils 2 (two) times daily as needed for allergies or rhinitis (nasal congestion)., Disp: 16 g, Rfl: 5   metFORMIN  (GLUCOPHAGE ) 500 MG tablet, Take 1 tablet (500 mg total) by mouth 2 (two) times daily with a meal., Disp:  180 tablet, Rfl: 3   norethindrone (MICRONOR) 0.35 MG tablet, Take 1 tablet by mouth daily., Disp: , Rfl:    olmesartan-hydrochlorothiazide (BENICAR HCT) 20-12.5 MG tablet, Take 1 tablet by mouth daily., Disp: , Rfl:    Olopatadine  HCl 0.2 % SOLN, Apply 1 drop to eye daily as needed (itchy/watery eyes)., Disp: 2.5 mL, Rfl: 5   spironolactone (ALDACTONE) 50 MG tablet, Take 50  mg by mouth at bedtime., Disp: , Rfl:    EPINEPHrine  0.3 mg/0.3 mL IJ SOAJ injection, Inject 0.3 mg into the muscle as needed for anaphylaxis. (Patient not taking: Reported on 12/21/2023), Disp: 1 each, Rfl: 2   KAITLIB FE 0.8-25 MG-MCG tablet, , Disp: , Rfl:    montelukast  (SINGULAIR ) 10 MG tablet, Take 1 tablet (10 mg total) by mouth at bedtime. (Patient not taking: Reported on 12/21/2023), Disp: 30 tablet, Rfl: 5   ondansetron  (ZOFRAN ) 4 MG tablet, Take 1 tablet (4 mg total) by mouth every 8 (eight) hours as needed for nausea or vomiting. (Patient not taking: Reported on 12/21/2023), Disp: 20 tablet, Rfl: 0  PAST MEDICAL HISTORY: Past Medical History:  Diagnosis Date   Diabetes mellitus without complication (HCC)    PCOS (polycystic ovarian syndrome)    Urticaria     PAST SURGICAL HISTORY: History reviewed. No pertinent surgical history.  FAMILY HISTORY: Family History  Problem Relation Age of Onset   Hypertension Mother    Asthma Sister    Eczema Brother    Allergic rhinitis Neg Hx    Urticaria Neg Hx     SOCIAL HISTORY: Social History   Socioeconomic History   Marital status: Single    Spouse name: Not on file   Number of children: Not on file   Years of education: Not on file   Highest education level: Not on file  Occupational History   Not on file  Tobacco Use   Smoking status: Never   Smokeless tobacco: Never  Vaping Use   Vaping status: Never Used  Substance and Sexual Activity   Alcohol  use: No   Drug use: No   Sexual activity: Yes    Birth control/protection: Pill  Other Topics Concern   Not on file  Social History Narrative   Not on file   Social Drivers of Health   Financial Resource Strain: Not on file  Food Insecurity: Low Risk  (06/15/2023)   Received from [facility]   Hunger Vital Sign    Within the past 12 months, you worried that your food would run out before you got money to buy more: Never true    Within the past 12 months, the food  you bought just didn't last and you didn't have money to get more. : Never true  Transportation Needs: No Transportation Needs (06/15/2023)   Received from [facility]    In the past 12 months, has lack of reliable transportation kept you from medical appointments, meetings, work or from getting things needed for daily living? : No  Physical Activity: Not on file  Stress: Not on file  Social Connections: Not on file  Intimate Partner Violence: Not on file       PHYSICAL EXAM  Vitals:   12/21/23 1416  BP: (!) 170/128  Pulse: 74  SpO2: 99%  Weight: 244 lb (110.7 kg)  Height: 5' 2 (1.575 m)    Body mass index is 44.63 kg/m.   General: The patient is well-developed and well-nourished and in no acute  distress.  She has mild tenderness over the temples / temporal arteries bilaterally  HEENT:  Head is [place]/AT.  Sclera are anicteric.  Funduscopic exam shows normal optic discs and retinal vessels.  Neck: No carotid bruits are noted.  The neck is nontender.  Cardiovascular: The heart has a regular rate and rhythm with a normal S1 and S2. There were no murmurs, gallops or rubs.    Skin: Extremities are without rash or  edema.  Musculoskeletal:  Back is nontender  Neurologic Exam  Mental status: The patient is alert and oriented x 3 at the time of the examination. The patient has apparent normal recent and remote memory, with an apparently normal attention span and concentration ability.   Speech is normal.  Cranial nerves: Extraocular movements are full. Pupils are equal, round, and reactive to light and accomodation.  Visual fields are full.  Facial symmetry is present. There is good facial sensation to soft touch bilaterally.Facial strength is normal.  Trapezius and sternocleidomastoid strength is normal. No dysarthria is noted.  The tongue is midline, and the patient has symmetric elevation of the soft palate. No obvious hearing deficits are noted.  Motor:   Muscle bulk is normal.   Tone is normal. Strength is  5 / 5 in all 4 extremities.   Sensory: Sensory testing is intact to pinprick, soft touch and vibration sensation in all 4 extremities.  Coordination: Cerebellar testing reveals good finger-nose-finger and heel-to-shin bilaterally.  Gait and station: Station is normal.   Gait is normal. Tandem gait is normal. Romberg is negative.   Reflexes: Deep tendon reflexes are symmetric and normal bilaterally.       DIAGNOSTIC DATA (LABS, IMAGING, TESTING) - I reviewed patient records, labs, notes, testing and imaging myself where available.  Lab Results  Component Value Date   WBC 4.9 08/07/2020   HGB 14.3 08/07/2020   HCT 44.3 08/07/2020   MCV 85 08/07/2020   PLT 258 08/07/2020      Component Value Date/Time   NA 138 08/07/2020 1332   K 5.0 08/07/2020 1332   CL 104 08/07/2020 1332   CO2 20 08/07/2020 1332   GLUCOSE 213 (H) 08/07/2020 1332   BUN 11 08/07/2020 1332   CREATININE 0.80 08/07/2020 1332   CALCIUM 9.4 08/07/2020 1332   PROT 7.4 08/07/2020 1332   ALBUMIN 4.3 08/07/2020 1332   AST 15 08/07/2020 1332   ALT 19 08/07/2020 1332   ALKPHOS 87 08/07/2020 1332   BILITOT <0.2 08/07/2020 1332   Lab Results  Component Value Date   CHOL 180 08/07/2020   HDL 42 08/07/2020   LDLCALC 120 (H) 08/07/2020   TRIG 100 08/07/2020   CHOLHDL 4.3 08/07/2020   Lab Results  Component Value Date   HGBA1C 9.8 (H) 08/07/2020   No results found for: VITAMINB12 No results found for: TSH     ASSESSMENT AND PLAN  Other headache syndrome - Plan: Sedimentation rate, C-reactive protein  Diabetes mellitus without complication (HCC)  Scalp irritation  Chronic ethmoidal sinusitis   In summary, Ms Creelman is a 33 year old woman with episode of atypical facial pain lasting 3 days that occur every 1 to 2 months.  The etiology is uncertain.  I reviewed the CT scan of the sinuses and she has just minimal chronic inflammatory changes that  would not be expected to be symptomatic.  She was tender over the temples and I will check an ESR and CRP to assess for arteritis though that would be unlikely  at her age.  Exam was otherwise normal.  Recently she tried Excedrin Migraine for one of the headaches and that seemed to knock it out quickly and it did not return.  Therefore, she will try this for the next couple of headaches.  If this is unsuccessful or if the headaches intensify or become much more common we will check an MRI of the brain to further evaluate.  Thank you for asking me to see this patient.  I did not schedule follow-up but will see her back if she has new or worsening neurologic please let me know if I can be of further assistance with her or other patients in the future   Alfard Cochrane A. Vear, MD, [facility] 12/21/2023, 2:38 PM Certified in Neurology, Clinical Neurophysiology, Sleep Medicine and Neuroimaging  [facility] [address] [place], [place] [address] [phone]

## 2023-12-21 ENCOUNTER — Encounter: Payer: Self-pay | Admitting: Neurology

## 2023-12-21 ENCOUNTER — Ambulatory Visit: Admitting: Neurology

## 2023-12-21 VITALS — BP 170/128 | HR 74 | Ht 62.0 in | Wt 244.0 lb

## 2023-12-21 DIAGNOSIS — Z7984 Long term (current) use of oral hypoglycemic drugs: Secondary | ICD-10-CM

## 2023-12-21 DIAGNOSIS — E119 Type 2 diabetes mellitus without complications: Secondary | ICD-10-CM | POA: Diagnosis not present

## 2023-12-21 DIAGNOSIS — J322 Chronic ethmoidal sinusitis: Secondary | ICD-10-CM

## 2023-12-21 DIAGNOSIS — R238 Other skin changes: Secondary | ICD-10-CM

## 2023-12-21 DIAGNOSIS — G4489 Other headache syndrome: Secondary | ICD-10-CM | POA: Diagnosis not present

## 2023-12-22 ENCOUNTER — Ambulatory Visit: Payer: Self-pay | Admitting: Neurology

## 2023-12-22 LAB — C-REACTIVE PROTEIN: CRP: 3 mg/L (ref 0–10)

## 2023-12-22 LAB — SEDIMENTATION RATE: Sed Rate: 13 mm/h (ref 0–32)
